# Patient Record
Sex: Female | Born: 1955 | Race: White | Hispanic: No | Marital: Married | Smoking: Never smoker
Health system: Southern US, Community
[De-identification: ages and names within clinical notes are randomized; demographics above are authoritative.]

## PROBLEM LIST (undated history)

## (undated) DIAGNOSIS — M1990 Unspecified osteoarthritis, unspecified site: Secondary | ICD-10-CM

## (undated) DIAGNOSIS — D649 Anemia, unspecified: Secondary | ICD-10-CM

## (undated) DIAGNOSIS — K219 Gastro-esophageal reflux disease without esophagitis: Secondary | ICD-10-CM

## (undated) DIAGNOSIS — F32A Depression, unspecified: Secondary | ICD-10-CM

## (undated) HISTORY — PX: ABDOMINAL HYSTERECTOMY: SHX81

## (undated) HISTORY — PX: HYSTERECTOMY ABDOMINAL WITH SALPINGECTOMY: SHX6725

## (undated) HISTORY — DX: Unspecified osteoarthritis, unspecified site: M19.90

## (undated) HISTORY — DX: Anemia, unspecified: D64.9

## (undated) HISTORY — DX: Depression, unspecified: F32.A

## (undated) HISTORY — PX: TONSILECTOMY, ADENOIDECTOMY, BILATERAL MYRINGOTOMY AND TUBES: SHX2538

## (undated) HISTORY — PX: TUBAL LIGATION: SHX77

## (undated) HISTORY — DX: Gastro-esophageal reflux disease without esophagitis: K21.9

---

## 2001-07-20 ENCOUNTER — Other Ambulatory Visit: Admission: RE | Admit: 2001-07-20 | Discharge: 2001-07-20 | Payer: Self-pay | Admitting: *Deleted

## 2002-08-31 ENCOUNTER — Other Ambulatory Visit: Admission: RE | Admit: 2002-08-31 | Discharge: 2002-08-31 | Payer: Self-pay | Admitting: *Deleted

## 2002-10-26 ENCOUNTER — Ambulatory Visit (HOSPITAL_COMMUNITY): Admission: RE | Admit: 2002-10-26 | Discharge: 2002-10-26 | Payer: Self-pay | Admitting: *Deleted

## 2003-09-10 ENCOUNTER — Other Ambulatory Visit: Admission: RE | Admit: 2003-09-10 | Discharge: 2003-09-10 | Payer: Self-pay | Admitting: *Deleted

## 2003-12-10 ENCOUNTER — Observation Stay (HOSPITAL_COMMUNITY): Admission: RE | Admit: 2003-12-10 | Discharge: 2003-12-11 | Payer: Self-pay | Admitting: *Deleted

## 2004-10-02 ENCOUNTER — Ambulatory Visit: Payer: Self-pay | Admitting: Internal Medicine

## 2004-10-05 ENCOUNTER — Ambulatory Visit (HOSPITAL_COMMUNITY): Admission: RE | Admit: 2004-10-05 | Discharge: 2004-10-05 | Payer: Self-pay | Admitting: Internal Medicine

## 2010-08-12 ENCOUNTER — Emergency Department (HOSPITAL_BASED_OUTPATIENT_CLINIC_OR_DEPARTMENT_OTHER)
Admission: EM | Admit: 2010-08-12 | Discharge: 2010-08-12 | Disposition: A | Discharge status: Home / Self Care | Payer: BLUE CROSS/BLUE SHIELD | Attending: Emergency Medicine | Admitting: Emergency Medicine

## 2010-08-12 DIAGNOSIS — R209 Unspecified disturbances of skin sensation: Secondary | ICD-10-CM | POA: Insufficient documentation

## 2010-08-12 DIAGNOSIS — M62838 Other muscle spasm: Secondary | ICD-10-CM | POA: Insufficient documentation

## 2018-07-13 DIAGNOSIS — J382 Nodules of vocal cords: Secondary | ICD-10-CM | POA: Insufficient documentation

## 2018-09-14 ENCOUNTER — Ambulatory Visit: Payer: 59 | Admitting: Orthopaedic Surgery

## 2018-09-14 ENCOUNTER — Encounter: Payer: Self-pay | Admitting: Orthopaedic Surgery

## 2018-09-14 ENCOUNTER — Other Ambulatory Visit: Payer: Self-pay

## 2018-09-14 ENCOUNTER — Ambulatory Visit: Payer: Self-pay

## 2018-09-14 ENCOUNTER — Encounter (INDEPENDENT_AMBULATORY_CARE_PROVIDER_SITE_OTHER): Payer: Self-pay

## 2018-09-14 VITALS — BP 152/90 | HR 81 | Ht 71.0 in | Wt 266.0 lb

## 2018-09-14 DIAGNOSIS — M659 Synovitis and tenosynovitis, unspecified: Secondary | ICD-10-CM

## 2018-09-14 DIAGNOSIS — M25562 Pain in left knee: Secondary | ICD-10-CM | POA: Diagnosis not present

## 2018-09-14 MED ORDER — BUPIVACAINE HCL 0.25 % IJ SOLN
2.0000 mL | INTRAMUSCULAR | Status: AC | PRN
  Administered 2018-09-14: 17:00:00 2 mL via INTRA_ARTICULAR

## 2018-09-14 MED ORDER — METHYLPREDNISOLONE ACETATE 40 MG/ML IJ SUSP
80.0000 mg | INTRAMUSCULAR | Status: AC | PRN
  Administered 2018-09-14: 17:00:00 80 mg via INTRA_ARTICULAR

## 2018-09-14 MED ORDER — LIDOCAINE HCL 1 % IJ SOLN
2.0000 mL | INTRAMUSCULAR | Status: AC | PRN
  Administered 2018-09-14: 2 mL

## 2018-09-14 NOTE — Progress Notes (Signed)
Office Visit Note   Patient: Natalie Myers           Date of Birth: [birth year]           MRN: [id number] Visit Date: 09/14/2018              Requested by: Angelica Chessman, MD [address] [place], [place] [address] PCP: Angelica Chessman, MD   Assessment & Plan: Visit Diagnoses:  1. Acute pain of left knee     Plan:  #1: Cortico steroid injection to the left knee was accomplished atraumatically. #2: If this fails then she may need to consider MRI scanning.  Follow-Up Instructions: Return if symptoms worsen or fail to improve.   Orders:  Orders Placed This Encounter  Procedures  . XR KNEE 3 VIEW LEFT   No orders of the defined types were placed in this encounter.     Procedures: Large Joint Inj: L knee on 09/14/2018 4:59 PM Indications: pain and diagnostic evaluation Details: 25 G 1.5 in needle, anteromedial approach  Arthrogram: No  Medications: 2 mL lidocaine 1 %; 80 mg methylPREDNISolone acetate 40 MG/ML; 2 mL bupivacaine 0.25 % Procedure, treatment alternatives, risks and benefits explained, specific risks discussed. Consent was given by the patient. Patient was prepped and draped in the usual sterile fashion.       Clinical Data: No additional findings.   Subjective: Chief Complaint  Patient presents with  . Left Knee - Pain   HPI Patient presents today with left knee pain. She said that it has been present for a week. No known injury. The pain was initially in the posterior knee, but now also hurts anteriorly. Some mild swelling. No popping, clicking, or grinding. The pain is constant, but gets worse with certain movements. She is taking tylenol as needed.    Review of Systems  Constitutional: Negative for fatigue.  HENT: Negative for ear pain.   Eyes: Negative for pain.  Respiratory: Negative for shortness of breath.   Cardiovascular: Negative for leg swelling.  Gastrointestinal: Negative for constipation and diarrhea.  Endocrine:  Negative for cold intolerance and heat intolerance.  Genitourinary: Negative for difficulty urinating.  Musculoskeletal: Positive for joint swelling.  Skin: Negative for rash.  Allergic/Immunologic: Negative for food allergies.  Neurological: Negative for weakness.  Hematological: Does not bruise/bleed easily.  Psychiatric/Behavioral: Positive for sleep disturbance.     Objective: Vital Signs: BP (!) 152/90   Pulse 81   Ht 5\' 11"  (1.803 m)   Wt 266 lb (120.7 kg)   BMI 37.10 kg/m   Physical Exam Constitutional:      Appearance: She is well-developed.  Eyes:     Pupils: Pupils are equal, round, and reactive to light.  Pulmonary:     Effort: Pulmonary effort is normal.  Skin:    General: Skin is warm and dry.  Neurological:     Mental Status: She is alert and oriented to person, place, and time.  Psychiatric:        Behavior: Behavior normal.     Ortho Exam  Exam today reveals a very large left knee on exam.  She does have swelling in the posterior aspect of the knee as well as what appears to be a mild effusion of the knee without warmth or erythema.  She does have some patellofemoral crepitance this is mild.  Tender over the inferior pole the patella as well as the medial joint line.  McMurray's is not prominent.  Possibly a little bit of an anterior drawer but has a good endpoint.  Specialty Comments:  No specialty comments available.  Imaging: Xr Knee 3 View Left  Result Date: 09/14/2018 Basis with some peaking of the intercondylar spines.  There may be some subtle signs of chondrocalcinosis on the medial lateral joint spaces on the AP x-ray.  Does have some lateral positioning of the patella.  There appears to be some eminence of the proximal patella insertion.    PMFS History: Current Outpatient Medications  Medication Sig Dispense Refill  . acetaminophen (TYLENOL) 325 MG tablet Take 650 mg by mouth every 6 (six) hours as needed.    Marland Kitchen. omeprazole (PRILOSEC) 40 MG  capsule      No current facility-administered medications for this visit.     There are no active problems to display for this patient.  History reviewed. No pertinent past medical history.  No family history on file.  Past Surgical History:  Procedure Laterality Date  . CESAREAN SECTION    . HYSTERECTOMY ABDOMINAL WITH SALPINGECTOMY    . TONSILECTOMY, ADENOIDECTOMY, BILATERAL MYRINGOTOMY AND TUBES     Social History   Occupational History  . Not on file  Tobacco Use  . Smoking status: Never Smoker  . Smokeless tobacco: Never Used  Substance and Sexual Activity  . Alcohol use: Never    Frequency: Never  . Drug use: Not on file  . Sexual activity: Not on file

## 2018-09-29 ENCOUNTER — Ambulatory Visit: Payer: 59 | Admitting: Orthopaedic Surgery

## 2018-10-02 ENCOUNTER — Other Ambulatory Visit: Payer: Self-pay

## 2018-10-02 ENCOUNTER — Ambulatory Visit: Payer: 59 | Admitting: Orthopaedic Surgery

## 2018-10-02 ENCOUNTER — Encounter: Payer: Self-pay | Admitting: Orthopaedic Surgery

## 2018-10-02 DIAGNOSIS — M25562 Pain in left knee: Secondary | ICD-10-CM | POA: Diagnosis not present

## 2018-10-02 NOTE — Progress Notes (Signed)
Office Visit Note   Patient: NATALEA KRENGEL           Date of Birth: [birth year]           MRN: [id number] Visit Date: 10/02/2018              Requested by: Angelica Chessman, MD [address] [place], [place] [address] PCP: Angelica Chessman, MD   Assessment & Plan: Visit Diagnoses:  1. Acute pain of left knee     Plan: Persistent pain left knee acutely medially despite time, medicines and cortisone injection.  Concerned that she may have a tear of the medial meniscus or significant degenerative change that I cannot appreciate by plain film.  Pain has been present now about a month.  Patient walks with a limp.  Will order MRI scan  Follow-Up Instructions: No follow-ups on file.   Orders:  Orders Placed This Encounter  Procedures  . MR Knee Left w/o contrast   No orders of the defined types were placed in this encounter.     Procedures: No procedures performed   Clinical Data: No additional findings.   Subjective: Chief Complaint  Patient presents with  . Left Knee - Follow-up  Patient presents today for a follow up on her left knee. She received a cortisone injection on 09/14/18. Patient states that the injection did not help. She is taking Advil as needed for pain.   HPI  Review of Systems   Objective: Vital Signs: BP (!) 154/87   Pulse 79   Ht 5\' 11"  (1.803 m)   Wt 266 lb (120.7 kg)   BMI 37.10 kg/m   Physical Exam Constitutional:      Appearance: She is well-developed.  Eyes:     Pupils: Pupils are equal, round, and reactive to light.  Pulmonary:     Effort: Pulmonary effort is normal.  Skin:    General: Skin is warm and dry.  Neurological:     Mental Status: She is alert and oriented to person, place, and time.  Psychiatric:        Behavior: Behavior normal.     Ortho Exam awake alert and oriented x3.  Comfortable sitting.  Does walk with a limp referable to her left knee.  Has predominately medial joint pain.  Large knees.  Does  have adipose tissue both medially and laterally more so on the left knee than she does on the right but that area is not painful.  No popping or clicking.  Full extension flexion probably 100 degrees.  No evidence of instability.  No calf pain.  No hip discomfort is.  Straight leg raise negative  Specialty Comments:  No specialty comments available.  Imaging: No results found.   PMFS History: Patient Active Problem List   Diagnosis Date Noted  . Acute pain of left knee 10/02/2018   History reviewed. No pertinent past medical history.  History reviewed. No pertinent family history.  Past Surgical History:  Procedure Laterality Date  . CESAREAN SECTION    . HYSTERECTOMY ABDOMINAL WITH SALPINGECTOMY    . TONSILECTOMY, ADENOIDECTOMY, BILATERAL MYRINGOTOMY AND TUBES     Social History   Occupational History  . Not on file  Tobacco Use  . Smoking status: Never Smoker  . Smokeless tobacco: Never Used  Substance and Sexual Activity  . Alcohol use: Never    Frequency: Never  . Drug use: Not on file  . Sexual activity: Not on file

## 2018-10-21 ENCOUNTER — Other Ambulatory Visit: Payer: Self-pay

## 2018-10-21 ENCOUNTER — Ambulatory Visit
Admission: RE | Admit: 2018-10-21 | Discharge: 2018-10-21 | Disposition: A | Discharge status: Home / Self Care | Payer: 59 | Source: Ambulatory Visit | Attending: Orthopaedic Surgery | Admitting: Orthopaedic Surgery

## 2018-10-21 DIAGNOSIS — M25562 Pain in left knee: Secondary | ICD-10-CM

## 2018-10-24 ENCOUNTER — Encounter: Payer: Self-pay | Admitting: Orthopaedic Surgery

## 2018-10-24 ENCOUNTER — Other Ambulatory Visit: Payer: Self-pay

## 2018-10-24 ENCOUNTER — Ambulatory Visit (INDEPENDENT_AMBULATORY_CARE_PROVIDER_SITE_OTHER): Payer: 59 | Admitting: Orthopaedic Surgery

## 2018-10-24 VITALS — BP 168/98 | HR 70 | Ht 71.0 in | Wt 265.0 lb

## 2018-10-24 DIAGNOSIS — M25562 Pain in left knee: Secondary | ICD-10-CM

## 2018-10-24 NOTE — Progress Notes (Signed)
Office Visit Note   Patient: Natalie Myers           Date of Birth: 07-11-1955           MRN: [id number] Visit Date: 10/24/2018              Requested by: Natalie Myers, Natalie M, MD [address] [place],  [place] [id number] PCP: Natalie Myers, Natalie M, MD   Assessment & Plan: Visit Diagnoses:  1. Acute pain of left knee     Plan: MRI scan demonstrates a tear of the medial meniscus associated with tricompartmental degenerative changes and stress reaction in the medial tibial plateau.  Long discussion over 30 minutes regarding all the above and treatment options.  Mrs. Natalie Myers relates that she is actually feeling a little bit better.  She like to continue with her exercises and over-the-counter medicines and monitor her response.  I would consider knee arthroscopy to debride the medial meniscus and possibly some of the articular cartilage abnormalities as she does have some mechanical symptoms.  She is fully aware that a lot of her problems might be related to the arthritis and arthroscopy would not solve that.  She does have a BMI of 37 and I have discussed exercises and weight loss as well.  Follow-Up Instructions: Return if symptoms worsen or fail to improve.   Orders:  No orders of the defined types were placed in this encounter.  No orders of the defined types were placed in this encounter.     Procedures: No procedures performed   Clinical Data: No additional findings.   Subjective: Chief Complaint  Patient presents with  . Left Knee - Follow-up  Patient presents today for a three week follow up on her left knee. She had an MRI on 10/21/18 and is here today for those results. Patient states that her knee still hurts, but doing better. She takes Advil as needed.  HPI  Review of Systems   Objective: Vital Signs: BP (!) 168/98   Pulse 70   Ht 5\' 11"  (1.803 Myers)   Wt 265 lb (120.2 kg)   BMI 36.96 kg/Myers   Physical Exam Constitutional:      Appearance: She is  well-developed.  Eyes:     Pupils: Pupils are equal, round, and reactive to light.  Pulmonary:     Effort: Pulmonary effort is normal.  Skin:    General: Skin is warm and dry.  Neurological:     Mental Status: She is alert and oriented to person, place, and time.  Psychiatric:        Behavior: Behavior normal.     Ortho Exam large left knee.  Difficult to know if there was an effusion.  Predominately medial joint pain diffusely.  No pain laterally.  Some patellar crepitation but no pain with patellar compression.  No calf pain.  Mild ankle edema edema that is nonpitting.  Painless range of motion both hips  Specialty Comments:  No specialty comments available.  Imaging: No results found.   PMFS History: Patient Active Problem List   Diagnosis Date Noted  . Acute pain of left knee 10/02/2018   History reviewed. No pertinent past medical history.  History reviewed. No pertinent family history.  Past Surgical History:  Procedure Laterality Date  . CESAREAN SECTION    . HYSTERECTOMY ABDOMINAL WITH SALPINGECTOMY    . TONSILECTOMY, ADENOIDECTOMY, BILATERAL MYRINGOTOMY AND TUBES     Social History   Occupational History  .  Not on file  Tobacco Use  . Smoking status: Never Smoker  . Smokeless tobacco: Never Used  Substance and Sexual Activity  . Alcohol use: Never    Frequency: Never  . Drug use: Not on file  . Sexual activity: Not on file

## 2019-01-15 LAB — HM COLONOSCOPY

## 2019-05-23 DIAGNOSIS — Z860100 Personal history of colon polyps, unspecified: Secondary | ICD-10-CM | POA: Insufficient documentation

## 2019-05-23 DIAGNOSIS — K219 Gastro-esophageal reflux disease without esophagitis: Secondary | ICD-10-CM | POA: Insufficient documentation

## 2019-05-23 DIAGNOSIS — D649 Anemia, unspecified: Secondary | ICD-10-CM | POA: Insufficient documentation

## 2019-05-23 DIAGNOSIS — G8929 Other chronic pain: Secondary | ICD-10-CM | POA: Insufficient documentation

## 2019-10-16 DIAGNOSIS — D509 Iron deficiency anemia, unspecified: Secondary | ICD-10-CM | POA: Insufficient documentation

## 2019-11-01 IMAGING — MR MRI OF THE LEFT KNEE WITHOUT CONTRAST
4 of 6 series · 22 of 40 positions shown · non-contrast
Comparison: Radiographs 09/14/2018

CLINICAL DATA: Medial knee pain and weakness. Lifting injury 8
weeks ago.

EXAM:
MRI OF THE LEFT KNEE WITHOUT CONTRAST
TECHNIQUE: Multiplanar, multisequence MR imaging of the knee was performed. No
intravenous contrast was administered.

[Series 3: T2 fat-sat · axial · 4.0mm · 0.50mm/px · z∈[-86,+9]mm · 6 of 24 slices shown (1 of 2)]
[im 1/24]
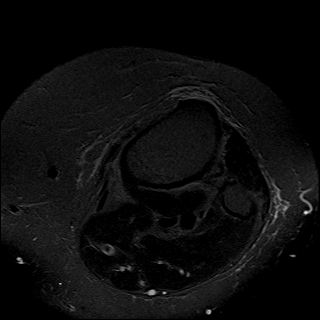
[im 4/24]
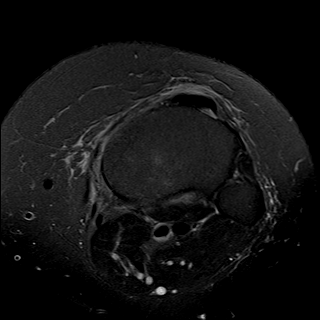
[im 8/24]
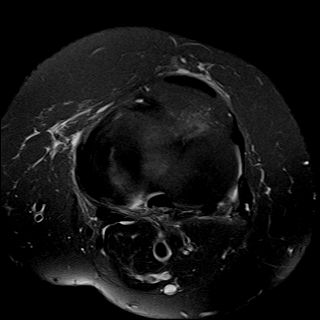
[im 12/24]
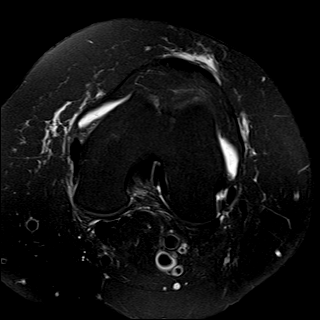
[im 16/24]
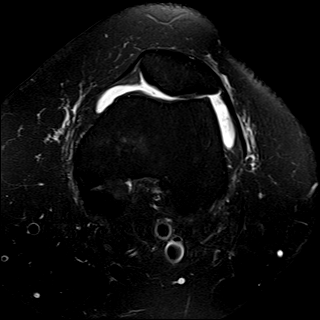
[im 20/24]
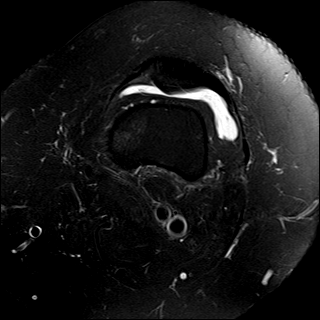

[Series 5: T2 fat-sat · coronal · 4.0mm · 0.29mm/px · 3 of 23 slices shown (2 of 2)]
[im 5/23]
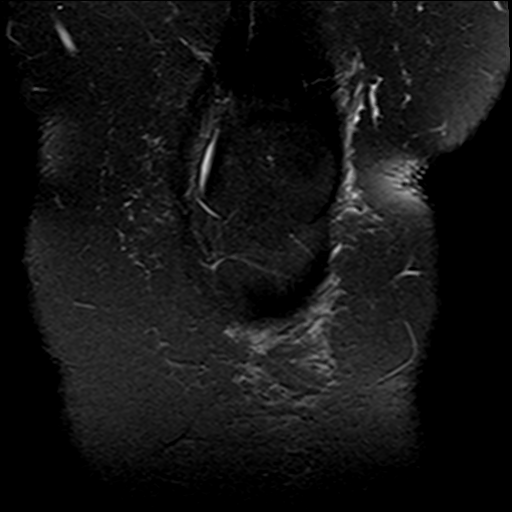
[im 14/23]
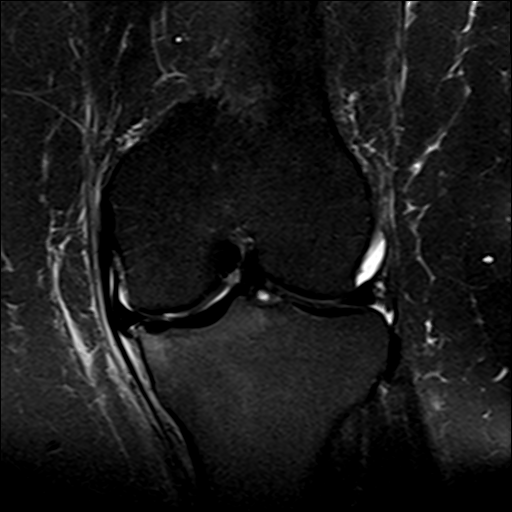
[im 23/23]
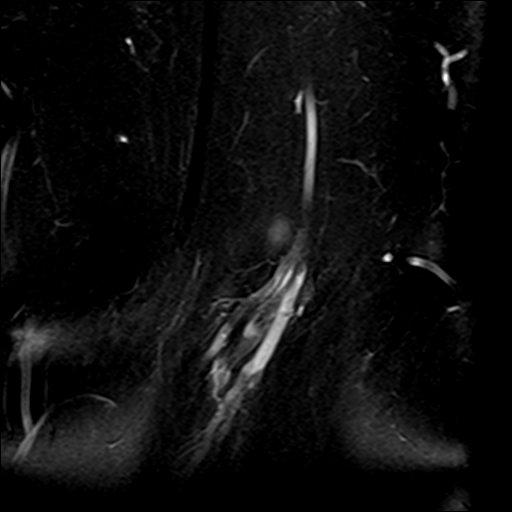

[Series 7: PD fat-sat · sagittal · 3.0mm · 0.29mm/px · 7 of 29 slices shown (1 of 2)]
[im 1/29]
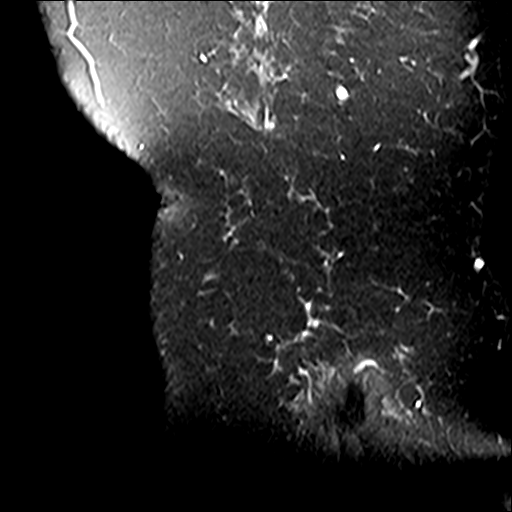
[im 5/29]
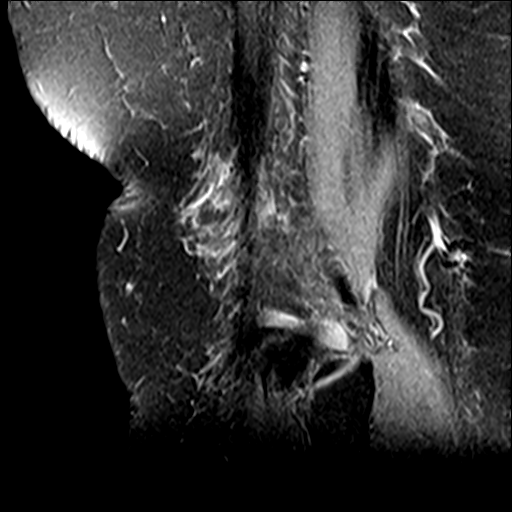
[im 10/29]
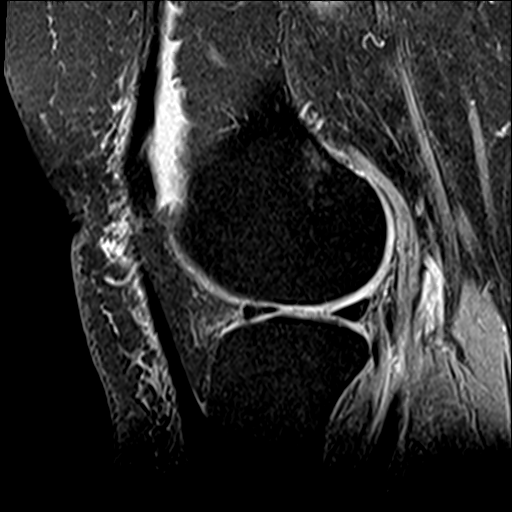
[im 15/29]
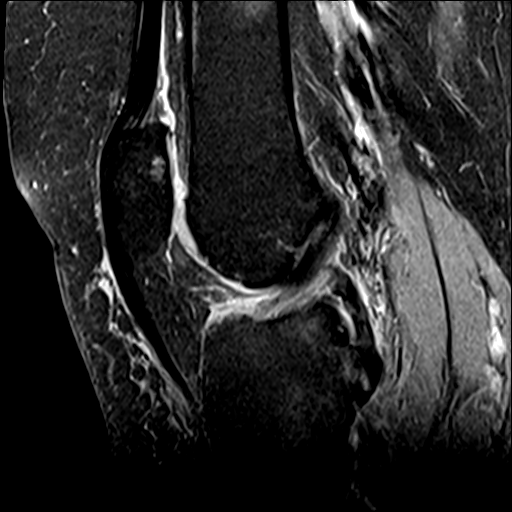
[im 19/29]
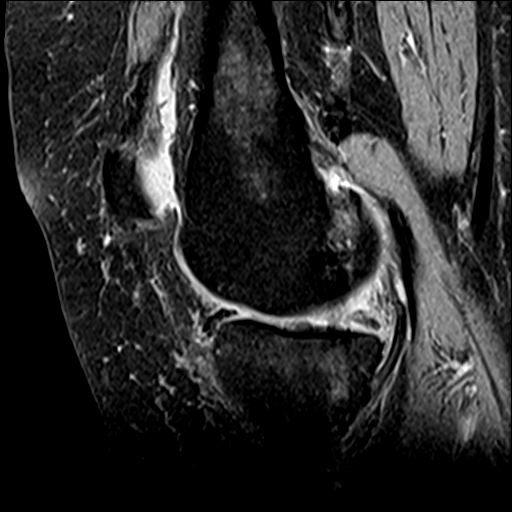
[im 24/29]
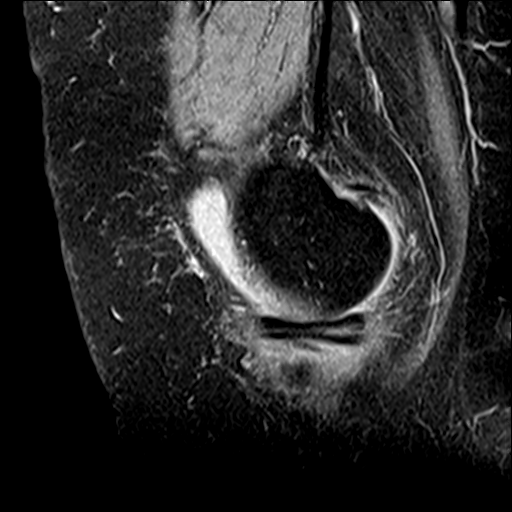
[im 29/29]
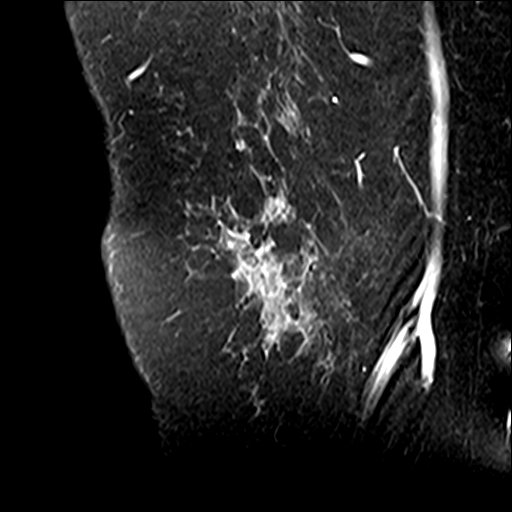

[Series 8: PD fat-sat · coronal · 4.0mm · 0.29mm/px · 6 of 25 slices shown (2 of 2)]
[im 1/25]
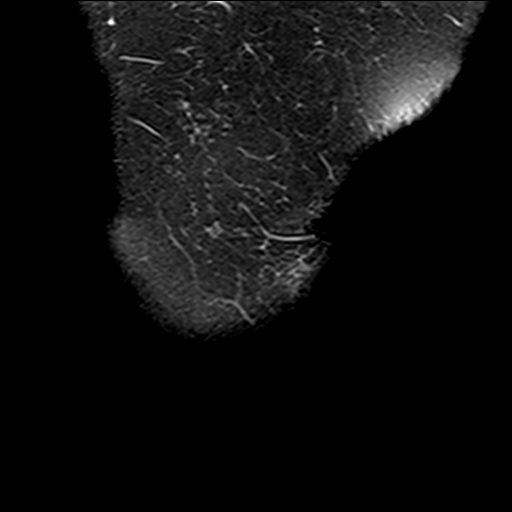
[im 5/25]
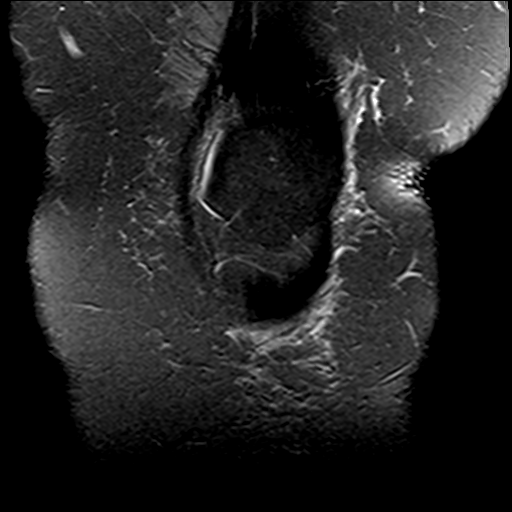
[im 10/25]
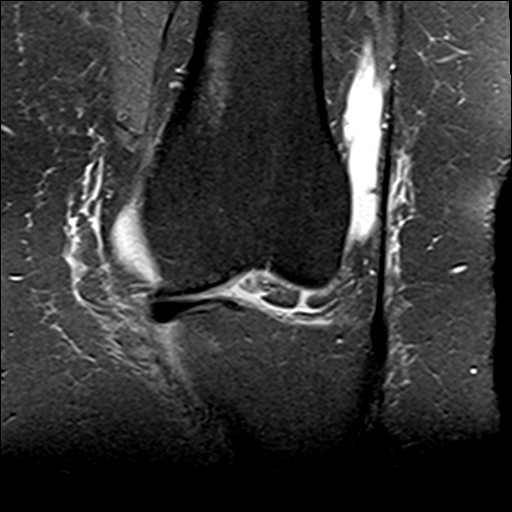
[im 15/25]
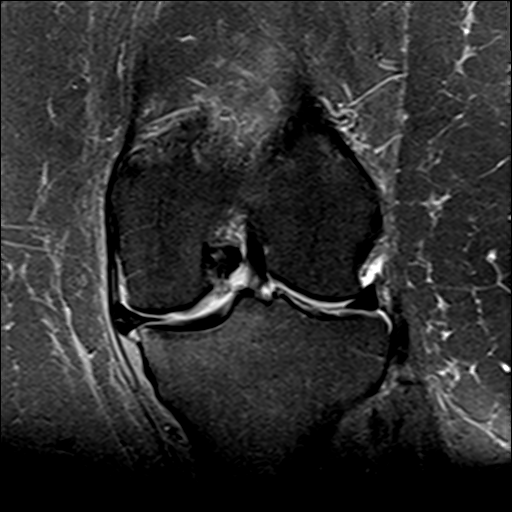
[im 20/25]
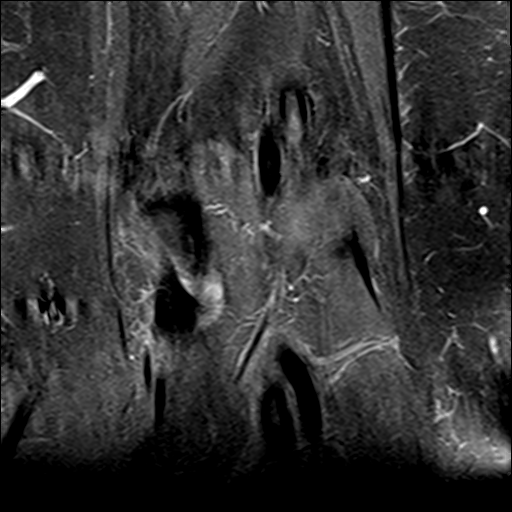
[im 25/25]
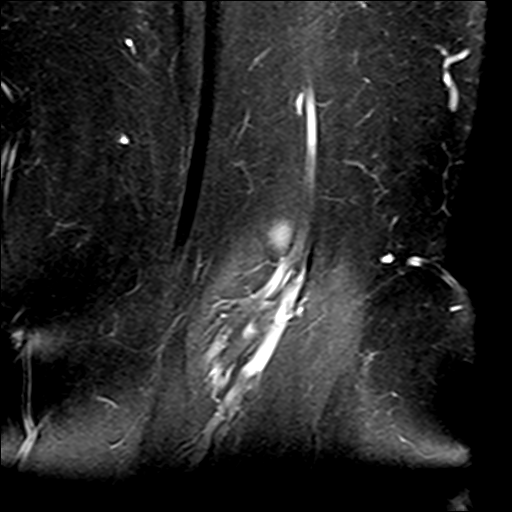

[22 of 40 positions shown; findings below may reference images not displayed]

FINDINGS: MENISCI

Medial meniscus: There is a full-thickness radial tear at the
meniscal root with detachment and associated medial protrusion of
the dysfunctional meniscus which demonstrates intrasubstance
mucinous degeneration. Maximum medial protrusion is approximately 5
mm.

Lateral meniscus:  Intact

LIGAMENTS

Cruciates:  Intact

Collaterals: Intact. Mild to moderate MCL and pes anserine bursitis.

CARTILAGE

Patellofemoral: Moderate degenerative chondrosis with areas of
moderate cartilage thinning and early subchondral cystic change.

Medial: Moderate degenerative chondrosis with areas of near
full-thickness cartilage loss. There is also joint space narrowing,
spurring and subchondral edema in the medial tibial plateau
medially. Findings suspicious for a small subchondral stress
fracture.

Lateral: Moderate degenerative chondrosis with early joint space
narrowing and spurring.

Joint:  Moderate-sized joint effusion and mild synovitis.

Popliteal Fossa:  Small Baker's cyst.

Extensor Mechanism: The patella retinacular structures are intact
and the quadriceps and patellar tendons are intact.

Bones: Subchondral stress edema in the medial tibial plateau with
suspected small subchondral stress fracture.

Other: Normal knee musculature.
IMPRESSION: 1. Radial tear involving the posterior horn of the medial meniscus
with detachment from the meniscal root and associated medial
protrusion of the meniscus.
2. Intact ligamentous structures.
3. Tricompartmental degenerative changes as detailed above.
4. Stress edema in the medial tibial plateau with suspected small
subchondral stress fracture.
5. Moderate-sized joint effusion and small Baker's cyst.

## 2022-03-29 DIAGNOSIS — D508 Other iron deficiency anemias: Secondary | ICD-10-CM | POA: Diagnosis not present

## 2022-03-29 DIAGNOSIS — M25562 Pain in left knee: Secondary | ICD-10-CM | POA: Diagnosis not present

## 2022-03-29 DIAGNOSIS — Z0000 Encounter for general adult medical examination without abnormal findings: Secondary | ICD-10-CM | POA: Diagnosis not present

## 2022-03-29 DIAGNOSIS — G8929 Other chronic pain: Secondary | ICD-10-CM | POA: Diagnosis not present

## 2022-03-29 DIAGNOSIS — M25559 Pain in unspecified hip: Secondary | ICD-10-CM | POA: Diagnosis not present

## 2022-03-29 DIAGNOSIS — Z780 Asymptomatic menopausal state: Secondary | ICD-10-CM | POA: Diagnosis not present

## 2022-03-29 DIAGNOSIS — E7800 Pure hypercholesterolemia, unspecified: Secondary | ICD-10-CM | POA: Diagnosis not present

## 2022-03-29 DIAGNOSIS — D649 Anemia, unspecified: Secondary | ICD-10-CM | POA: Diagnosis not present

## 2022-03-29 DIAGNOSIS — Z23 Encounter for immunization: Secondary | ICD-10-CM | POA: Diagnosis not present

## 2022-03-29 DIAGNOSIS — D229 Melanocytic nevi, unspecified: Secondary | ICD-10-CM | POA: Diagnosis not present

## 2022-03-29 DIAGNOSIS — Z0001 Encounter for general adult medical examination with abnormal findings: Secondary | ICD-10-CM | POA: Diagnosis not present

## 2022-03-29 DIAGNOSIS — Z6834 Body mass index (BMI) 34.0-34.9, adult: Secondary | ICD-10-CM | POA: Diagnosis not present

## 2022-04-14 DIAGNOSIS — E876 Hypokalemia: Secondary | ICD-10-CM | POA: Diagnosis not present

## 2022-05-05 DIAGNOSIS — M25561 Pain in right knee: Secondary | ICD-10-CM | POA: Diagnosis not present

## 2022-05-05 DIAGNOSIS — M1711 Unilateral primary osteoarthritis, right knee: Secondary | ICD-10-CM | POA: Diagnosis not present

## 2022-07-30 DIAGNOSIS — D509 Iron deficiency anemia, unspecified: Secondary | ICD-10-CM | POA: Diagnosis not present

## 2022-07-30 DIAGNOSIS — Z23 Encounter for immunization: Secondary | ICD-10-CM | POA: Diagnosis not present

## 2022-07-30 DIAGNOSIS — R030 Elevated blood-pressure reading, without diagnosis of hypertension: Secondary | ICD-10-CM | POA: Diagnosis not present

## 2022-07-30 DIAGNOSIS — E876 Hypokalemia: Secondary | ICD-10-CM | POA: Diagnosis not present

## 2022-07-30 DIAGNOSIS — Z79899 Other long term (current) drug therapy: Secondary | ICD-10-CM | POA: Diagnosis not present

## 2022-09-28 DIAGNOSIS — E876 Hypokalemia: Secondary | ICD-10-CM | POA: Diagnosis not present

## 2022-09-28 DIAGNOSIS — Z23 Encounter for immunization: Secondary | ICD-10-CM | POA: Diagnosis not present

## 2022-09-28 DIAGNOSIS — Z1231 Encounter for screening mammogram for malignant neoplasm of breast: Secondary | ICD-10-CM | POA: Diagnosis not present

## 2022-09-28 LAB — HM MAMMOGRAPHY: HM Mammogram: NORMAL (ref 0–4)

## 2022-11-09 DIAGNOSIS — L814 Other melanin hyperpigmentation: Secondary | ICD-10-CM | POA: Diagnosis not present

## 2022-11-09 DIAGNOSIS — D1801 Hemangioma of skin and subcutaneous tissue: Secondary | ICD-10-CM | POA: Diagnosis not present

## 2022-11-09 DIAGNOSIS — L821 Other seborrheic keratosis: Secondary | ICD-10-CM | POA: Diagnosis not present

## 2022-11-09 DIAGNOSIS — D508 Other iron deficiency anemias: Secondary | ICD-10-CM | POA: Diagnosis not present

## 2022-11-09 DIAGNOSIS — K219 Gastro-esophageal reflux disease without esophagitis: Secondary | ICD-10-CM | POA: Diagnosis not present

## 2022-11-09 DIAGNOSIS — R1013 Epigastric pain: Secondary | ICD-10-CM | POA: Diagnosis not present

## 2022-11-09 DIAGNOSIS — L570 Actinic keratosis: Secondary | ICD-10-CM | POA: Diagnosis not present

## 2022-11-09 DIAGNOSIS — Z6836 Body mass index (BMI) 36.0-36.9, adult: Secondary | ICD-10-CM | POA: Diagnosis not present

## 2023-01-11 DIAGNOSIS — D509 Iron deficiency anemia, unspecified: Secondary | ICD-10-CM | POA: Diagnosis not present

## 2023-01-20 DIAGNOSIS — D508 Other iron deficiency anemias: Secondary | ICD-10-CM | POA: Diagnosis not present

## 2023-01-27 DIAGNOSIS — D508 Other iron deficiency anemias: Secondary | ICD-10-CM | POA: Diagnosis not present

## 2023-02-03 DIAGNOSIS — D508 Other iron deficiency anemias: Secondary | ICD-10-CM | POA: Diagnosis not present

## 2023-02-10 DIAGNOSIS — D508 Other iron deficiency anemias: Secondary | ICD-10-CM | POA: Diagnosis not present

## 2023-02-17 DIAGNOSIS — D508 Other iron deficiency anemias: Secondary | ICD-10-CM | POA: Diagnosis not present

## 2023-02-24 DIAGNOSIS — D508 Other iron deficiency anemias: Secondary | ICD-10-CM | POA: Diagnosis not present

## 2023-05-05 DIAGNOSIS — K449 Diaphragmatic hernia without obstruction or gangrene: Secondary | ICD-10-CM | POA: Diagnosis not present

## 2023-05-05 DIAGNOSIS — R918 Other nonspecific abnormal finding of lung field: Secondary | ICD-10-CM | POA: Diagnosis not present

## 2023-05-05 DIAGNOSIS — R0602 Shortness of breath: Secondary | ICD-10-CM | POA: Diagnosis not present

## 2023-05-24 DIAGNOSIS — R918 Other nonspecific abnormal finding of lung field: Secondary | ICD-10-CM | POA: Diagnosis not present

## 2023-05-24 DIAGNOSIS — K449 Diaphragmatic hernia without obstruction or gangrene: Secondary | ICD-10-CM | POA: Diagnosis not present

## 2023-05-24 DIAGNOSIS — R0600 Dyspnea, unspecified: Secondary | ICD-10-CM | POA: Diagnosis not present

## 2023-05-24 DIAGNOSIS — R0602 Shortness of breath: Secondary | ICD-10-CM | POA: Diagnosis not present

## 2023-05-24 DIAGNOSIS — I77810 Thoracic aortic ectasia: Secondary | ICD-10-CM | POA: Diagnosis not present

## 2023-05-31 DIAGNOSIS — J984 Other disorders of lung: Secondary | ICD-10-CM | POA: Diagnosis not present

## 2023-06-07 DIAGNOSIS — E6609 Other obesity due to excess calories: Secondary | ICD-10-CM | POA: Insufficient documentation

## 2023-06-07 DIAGNOSIS — Q790 Congenital diaphragmatic hernia: Secondary | ICD-10-CM | POA: Diagnosis not present

## 2023-06-07 DIAGNOSIS — J4520 Mild intermittent asthma, uncomplicated: Secondary | ICD-10-CM | POA: Insufficient documentation

## 2023-06-07 DIAGNOSIS — R1310 Dysphagia, unspecified: Secondary | ICD-10-CM | POA: Insufficient documentation

## 2023-06-07 DIAGNOSIS — R0609 Other forms of dyspnea: Secondary | ICD-10-CM | POA: Insufficient documentation

## 2023-06-07 DIAGNOSIS — K219 Gastro-esophageal reflux disease without esophagitis: Secondary | ICD-10-CM | POA: Diagnosis not present

## 2023-06-07 DIAGNOSIS — K449 Diaphragmatic hernia without obstruction or gangrene: Secondary | ICD-10-CM | POA: Insufficient documentation

## 2023-06-28 ENCOUNTER — Encounter: Payer: Self-pay | Admitting: Family Medicine

## 2023-06-28 ENCOUNTER — Ambulatory Visit (INDEPENDENT_AMBULATORY_CARE_PROVIDER_SITE_OTHER): Payer: BC Managed Care – PPO | Admitting: Family Medicine

## 2023-06-28 VITALS — BP 124/72 | HR 77 | Temp 98.4°F | Ht 69.75 in | Wt 262.2 lb

## 2023-06-28 DIAGNOSIS — Z6837 Body mass index (BMI) 37.0-37.9, adult: Secondary | ICD-10-CM

## 2023-06-28 DIAGNOSIS — D509 Iron deficiency anemia, unspecified: Secondary | ICD-10-CM | POA: Diagnosis not present

## 2023-06-28 DIAGNOSIS — Z23 Encounter for immunization: Secondary | ICD-10-CM | POA: Insufficient documentation

## 2023-06-28 DIAGNOSIS — Z780 Asymptomatic menopausal state: Secondary | ICD-10-CM | POA: Diagnosis not present

## 2023-06-28 DIAGNOSIS — Z1231 Encounter for screening mammogram for malignant neoplasm of breast: Secondary | ICD-10-CM

## 2023-06-28 DIAGNOSIS — R7309 Other abnormal glucose: Secondary | ICD-10-CM

## 2023-06-28 DIAGNOSIS — Z860100 Personal history of colon polyps, unspecified: Secondary | ICD-10-CM

## 2023-06-28 DIAGNOSIS — Z136 Encounter for screening for cardiovascular disorders: Secondary | ICD-10-CM | POA: Diagnosis not present

## 2023-06-28 DIAGNOSIS — K219 Gastro-esophageal reflux disease without esophagitis: Secondary | ICD-10-CM

## 2023-06-28 DIAGNOSIS — E669 Obesity, unspecified: Secondary | ICD-10-CM | POA: Insufficient documentation

## 2023-06-28 MED ORDER — SEMAGLUTIDE-WEIGHT MANAGEMENT 0.25 MG/0.5ML ~~LOC~~ SOAJ
0.2500 mg | SUBCUTANEOUS | 1 refills | Status: DC
Start: 2023-06-28 — End: 2023-07-08

## 2023-06-28 NOTE — Progress Notes (Signed)
 [id number]  Patient Office Visit  Assessment & Plan:  Iron deficiency anemia, unspecified iron deficiency anemia type -     CBC with Differential/Platelet -     COMPLETE METABOLIC PANEL WITH GFR -     Iron, TIBC and Ferritin Panel  History of colonic polyps  Needs flu shot -     Flu Vaccine Trivalent High Dose (Fluad)  Postmenopausal -     DG Bone Density; Future  Elevated glucose -     Lipid panel -     Hemoglobin A1c -     TSH  Visit for screening mammogram -     MM 3D DIAGNOSTIC MAMMOGRAM BILATERAL BREAST; Future  Obesity (BMI 30-39.9) -     Semaglutide-Weight Management; Inject 0.25 mg into the skin once a week.  Dispense: 2 mL; Refill: 1  Gastroesophageal reflux disease without esophagitis   Follow-up on lab work and notify patient.  Restart the Wegovy 0.25 mg weekly.  Patient is aware of potential negative side effects.  Return in the next 3 months or so for his complete physical. Recommend healthy diet i.e mediterranean/DASH diet, consistent exercise - 30 minutes 5 day per week, and gradual weight loss. DEXA scan and mammogram ordered at [facility].  Copy of the CT scan that was done at [facility] January 21 given and reviewed with the patient No follow-ups on file.   Subjective:    Patient ID: Natalie Myers, female    DOB: 25-Mar-1956  Age: 68 y.o. MRN: [id number]  No chief complaint on file.   HPI GERD-history of gastroesophageal reflux disease (GERD), managed with daily Protonix 40 mg, which she reports as effective. She has not experienced any side effects from this medication. reports no chronic sore throat, voice changes, hemoptysis, or hematochezia. Pt has large hiatal hernia and another type of hernia. Pt is doing much better with the Protonix medication and does not need any refills.  Shortness of breath- pt saw Pulmonary re ongoing shortness and breath and CXR ordered which showed an opacity which lead to the CT scan chest. Pt still having same symptoms. Never  smoked  IMPRESSION:  1. No acute pulmonary disease. No lung masses.  2. Large fat containing right Morgagni hernia, which accounts for  the chest radiograph abnormality on 05/05/2023 chest radiograph.  3. Large hiatal hernia.  4. 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 Recommend annual imaging  followup by CTA or MRA. This recommendation follows 2010  ACCF/AHA/AATS/ACR/ASA/SCA/SCAI/SIR/STS/SVM Guidelines for the  Diagnosis and Management of Patients with Thoracic Aortic Disease.  Circulation. 2010; 121: U440-H474. Aortic aneurysm NOS  (ICD10-I71.9).  5. Aortic Atherosclerosis (ICD10-I70.0).  Electronically Signed    By: Delbert Phenix M.D.    On: 06/03/2023 18:29  Dilated Ascending Aorta- pt will repeat this in one year.  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 Recommend annual imaging followup by CTA or MRA--Pt was not aware of this.  Obesity- pt has been gaining weight the past year or so. Pt was on Wegovy shots before but then could not get them at the pharmacy due to supply issues. Pt got frustrated so did not want to go back on the shots. Pt wants to get some help to lose weight. Pt is open to going to weight management at [facility] if [facility] shots are not covered by her insurance.  Iron deficiency Anemia-patient has had a 4-5 year history of iron deficiency anemia which has required IV iron infusions in [place].  Patient states initially she  felt much better when she got her first Iron IV infusion but now she does not feel any different.  Patient had an extensive workup per GI and there was no cause for her iron deficiency anemia so then saw hematology.  Patient is taking over-the-counter iron which is equivalent to Niferex-150 -2/day.  Patient does have upcoming appointment hematology in March for labs and wonders if she can cancel the appt Previous Hx of prediabetes-Pt has hx of this few years ago but then lost weight and A1C was in the normal range. Denies diarrhea, peripheral swelling,  hypoglycemia, excessive thirst, excessive urination, visual fluctuation, and fatigue.  Making an effort on diet control and exercise.  Pt has no previous hx of gestational diabetes..  Patient does eat healthy for the most part and stays active. Pt is worried that she is not able to lose weight the past several months.  Health maintenance-patient does need a mammogram.  Last mammogram was 2024.  Patient has never had a bone density but would like to do this the same day.  Up-to-date colonoscopy was done in 2020 and needs repeat in 2025 due to history of polyps. Pt has UTD Shingrix vaccines, UTD Tetanus shot Pt needs to get flu shot today.   The 10-year ASCVD risk score (Arnett DK, et al., 2019) is: 5.1%   Values used to calculate the score:     Age: 48 years     Sex: Female     Is Non-Hispanic African American: No     Diabetic: No     Tobacco smoker: No     Systolic Blood Pressure: 124 mmHg     Is BP treated: No     HDL Cholesterol: 76 mg/dL     Total Cholesterol: 249 mg/dL   The 91-YNWG ASCVD risk score (Arnett DK, et al., 2019) is: 5.1%  Past Medical History:  Diagnosis Date   Anemia    GERD (gastroesophageal reflux disease)    Past Surgical History:  Procedure Laterality Date   CESAREAN SECTION     1992, 1994   HYSTERECTOMY ABDOMINAL WITH SALPINGECTOMY     TONSILECTOMY, ADENOIDECTOMY, BILATERAL MYRINGOTOMY AND TUBES     Social History   Tobacco Use   Smoking status: Never   Smokeless tobacco: Never  Vaping Use   Vaping status: Never Used  Substance Use Topics   Alcohol use: Never   Drug use: Never   Family History  Problem Relation Age of Onset   Uterine cancer Mother    Skin cancer Mother    Head & neck cancer Father    Kidney cancer Father    Allergies  Allergen Reactions   Tape Rash    Adhesive tape    ROS    Objective:    BP 124/72   Pulse 77   Temp 98.4 F (36.9 C)   Ht 5' 9.75" (1.772 m)   Wt 262 lb 4 oz (119 kg)   SpO2 97%   BMI 37.90 kg/m   BP Readings from Last 3 Encounters:  06/28/23 124/72  10/24/18 (!) 168/98  10/02/18 (!) 154/87   Wt Readings from Last 3 Encounters:  06/28/23 262 lb 4 oz (119 kg)  10/24/18 265 lb (120.2 kg)  10/02/18 266 lb (120.7 kg)    Physical Exam Vitals and nursing note reviewed.  Constitutional:      General: She is not in acute distress.    Appearance: Normal appearance.  HENT:     Head: Normocephalic.  Right Ear: Tympanic membrane, ear canal and external ear normal.     Left Ear: Tympanic membrane, ear canal and external ear normal.  Eyes:     Extraocular Movements: Extraocular movements intact.     Conjunctiva/sclera: Conjunctivae normal.     Pupils: Pupils are equal, round, and reactive to light.  Cardiovascular:     Rate and Rhythm: Normal rate and regular rhythm.     Heart sounds: Normal heart sounds.  Pulmonary:     Effort: Pulmonary effort is normal.     Breath sounds: Normal breath sounds. No wheezing.  Abdominal:     General: Bowel sounds are normal.  Musculoskeletal:     Right lower leg: No edema.     Left lower leg: No edema.  Neurological:     General: No focal deficit present.     Mental Status: She is alert and oriented to person, place, and time.  Psychiatric:        Mood and Affect: Mood normal.        Behavior: Behavior normal.      Results for orders placed or performed in visit on 06/28/23  HM MAMMOGRAPHY  Result Value Ref Range   HM Mammogram Self Reported Normal 0-4 Bi-Rad, Self Reported Normal  HM COLONOSCOPY  Result Value Ref Range   HM Colonoscopy See Report (in chart) See Report (in chart), Patient Reported

## 2023-06-29 LAB — COMPLETE METABOLIC PANEL WITH GFR
AG Ratio: 1.6 (calc) (ref 1.0–2.5)
ALT: 15 U/L (ref 6–29)
AST: 16 U/L (ref 10–35)
Albumin: 4.4 g/dL (ref 3.6–5.1)
Alkaline phosphatase (APISO): 70 U/L (ref 37–153)
BUN: 21 mg/dL (ref 7–25)
CO2: 27 mmol/L (ref 20–32)
Calcium: 10.4 mg/dL (ref 8.6–10.4)
Chloride: 105 mmol/L (ref 98–110)
Creat: 0.86 mg/dL (ref 0.50–1.05)
Globulin: 2.8 g/dL (ref 1.9–3.7)
Glucose, Bld: 95 mg/dL (ref 65–99)
Potassium: 4.4 mmol/L (ref 3.5–5.3)
Sodium: 138 mmol/L (ref 135–146)
Total Bilirubin: 0.5 mg/dL (ref 0.2–1.2)
Total Protein: 7.2 g/dL (ref 6.1–8.1)
eGFR: 75 mL/min/{1.73_m2} (ref >=60)

## 2023-06-29 LAB — IRON,TIBC AND FERRITIN PANEL
%SAT: 11 % — ABNORMAL LOW (ref 16–45)
Ferritin: 11 ng/mL — ABNORMAL LOW (ref 16–288)
Iron: 47 ug/dL (ref 45–160)
TIBC: 440 ug/dL (ref 250–450)

## 2023-06-29 LAB — CBC WITH DIFFERENTIAL/PLATELET
Absolute Lymphocytes: 2360 {cells}/uL (ref 850–3900)
Absolute Monocytes: 519 {cells}/uL (ref 200–950)
Basophils Absolute: 17 {cells}/uL (ref 0–200)
Basophils Relative: 0.3 %
Eosinophils Absolute: 120 {cells}/uL (ref 15–500)
Eosinophils Relative: 2.1 %
HCT: 39.1 % (ref 35.0–45.0)
Hemoglobin: 12.8 g/dL (ref 11.7–15.5)
MCH: 30 pg (ref 27.0–33.0)
MCHC: 32.7 g/dL (ref 32.0–36.0)
MCV: 91.6 fL (ref 80.0–100.0)
MPV: 12.7 fL — ABNORMAL HIGH (ref 7.5–12.5)
Monocytes Relative: 9.1 %
Neutro Abs: 2685 {cells}/uL (ref 1500–7800)
Neutrophils Relative %: 47.1 %
Platelets: 230 10*3/uL (ref 140–400)
RBC: 4.27 10*6/uL (ref 3.80–5.10)
RDW: 13.8 % (ref 11.0–15.0)
Total Lymphocyte: 41.4 %
WBC: 5.7 10*3/uL (ref 3.8–10.8)

## 2023-06-29 LAB — LIPID PANEL
Cholesterol: 249 mg/dL — ABNORMAL HIGH (ref <200)
HDL: 76 mg/dL (ref >=50)
LDL Cholesterol (Calc): 143 mg/dL — ABNORMAL HIGH
Non-HDL Cholesterol (Calc): 173 mg/dL — ABNORMAL HIGH (ref <130)
Total CHOL/HDL Ratio: 3.3 (calc) (ref <5.0)
Triglycerides: 165 mg/dL — ABNORMAL HIGH (ref <150)

## 2023-06-29 LAB — HEMOGLOBIN A1C
Hgb A1c MFr Bld: 5.4 %{Hb} (ref <5.7)
Mean Plasma Glucose: 108 mg/dL
eAG (mmol/L): 6 mmol/L

## 2023-06-29 LAB — TSH: TSH: 1.55 m[IU]/L (ref 0.40–4.50)

## 2023-06-30 ENCOUNTER — Encounter: Payer: Self-pay | Admitting: Family Medicine

## 2023-07-04 ENCOUNTER — Other Ambulatory Visit: Payer: Self-pay

## 2023-07-04 DIAGNOSIS — Z1231 Encounter for screening mammogram for malignant neoplasm of breast: Secondary | ICD-10-CM

## 2023-07-06 ENCOUNTER — Encounter: Payer: Self-pay | Admitting: Family Medicine

## 2023-07-08 ENCOUNTER — Other Ambulatory Visit: Payer: Self-pay

## 2023-07-08 ENCOUNTER — Other Ambulatory Visit: Payer: Self-pay | Admitting: Family Medicine

## 2023-07-08 MED ORDER — TIRZEPATIDE-WEIGHT MANAGEMENT 2.5 MG/0.5ML ~~LOC~~ SOLN
2.5000 mg | SUBCUTANEOUS | 0 refills | Status: DC
Start: 2023-07-08 — End: 2023-07-11

## 2023-07-11 ENCOUNTER — Other Ambulatory Visit: Payer: Self-pay

## 2023-07-11 DIAGNOSIS — Z1231 Encounter for screening mammogram for malignant neoplasm of breast: Secondary | ICD-10-CM

## 2023-07-12 DIAGNOSIS — D509 Iron deficiency anemia, unspecified: Secondary | ICD-10-CM | POA: Diagnosis not present

## 2023-07-13 ENCOUNTER — Encounter: Payer: Self-pay | Admitting: Family Medicine

## 2023-07-25 ENCOUNTER — Encounter: Payer: Self-pay | Admitting: Family Medicine

## 2023-07-25 DIAGNOSIS — D649 Anemia, unspecified: Secondary | ICD-10-CM | POA: Diagnosis not present

## 2023-07-25 DIAGNOSIS — D508 Other iron deficiency anemias: Secondary | ICD-10-CM | POA: Diagnosis not present

## 2023-07-25 MED ORDER — POTASSIUM CHLORIDE ER 10 MEQ PO TBCR
10.0000 meq | EXTENDED_RELEASE_TABLET | Freq: Every day | ORAL | 1 refills | Status: DC
Start: 2023-07-25 — End: 2023-08-18

## 2023-08-01 DIAGNOSIS — D508 Other iron deficiency anemias: Secondary | ICD-10-CM | POA: Diagnosis not present

## 2023-08-01 DIAGNOSIS — D649 Anemia, unspecified: Secondary | ICD-10-CM | POA: Diagnosis not present

## 2023-08-08 DIAGNOSIS — D649 Anemia, unspecified: Secondary | ICD-10-CM | POA: Diagnosis not present

## 2023-08-08 DIAGNOSIS — D508 Other iron deficiency anemias: Secondary | ICD-10-CM | POA: Diagnosis not present

## 2023-08-15 DIAGNOSIS — D649 Anemia, unspecified: Secondary | ICD-10-CM | POA: Diagnosis not present

## 2023-08-15 DIAGNOSIS — D508 Other iron deficiency anemias: Secondary | ICD-10-CM | POA: Diagnosis not present

## 2023-08-17 ENCOUNTER — Other Ambulatory Visit: Payer: Self-pay | Admitting: Family Medicine

## 2023-08-18 NOTE — Telephone Encounter (Signed)
 Requested medications are due for refill today.  no  Requested medications are on the active medications list.  yes  Last refill. 07/25/2023 #30 1 rf  Future visit scheduled.   yes  Notes to clinic.  Pt is requesting a 90 day supply.    Requested Prescriptions  Pending Prescriptions Disp Refills   potassium chloride (KLOR-CON) 10 MEQ tablet [Pharmacy Med Name: POTASSIUM CL ER 10 MEQ TABLET] 90 tablet 1    Sig: TAKE 1 TABLET BY MOUTH EVERY DAY     Endocrinology:  Minerals - Potassium Supplementation Failed - 08/18/2023  1:44 PM      Failed - Valid encounter within last 12 months    Recent Outpatient Visits           1 month ago Iron deficiency anemia, unspecified iron deficiency anemia type   [facility] Amadeo June, MD              Passed - K in normal range and within 360 days    Potassium  Date Value Ref Range Status  06/28/2023 4.4 3.5 - 5.3 mmol/L Final         Passed - Cr in normal range and within 360 days    Creat  Date Value Ref Range Status  06/28/2023 0.86 0.50 - 1.05 mg/dL Final

## 2023-08-22 DIAGNOSIS — D649 Anemia, unspecified: Secondary | ICD-10-CM | POA: Diagnosis not present

## 2023-08-22 DIAGNOSIS — D508 Other iron deficiency anemias: Secondary | ICD-10-CM | POA: Diagnosis not present

## 2023-09-07 ENCOUNTER — Encounter: Payer: Self-pay | Admitting: Family Medicine

## 2023-10-04 DIAGNOSIS — Z1231 Encounter for screening mammogram for malignant neoplasm of breast: Secondary | ICD-10-CM | POA: Diagnosis not present

## 2023-10-04 DIAGNOSIS — M810 Age-related osteoporosis without current pathological fracture: Secondary | ICD-10-CM | POA: Diagnosis not present

## 2023-10-04 DIAGNOSIS — Z780 Asymptomatic menopausal state: Secondary | ICD-10-CM | POA: Diagnosis not present

## 2023-10-04 LAB — HM MAMMOGRAPHY

## 2023-10-05 ENCOUNTER — Ambulatory Visit: Payer: BC Managed Care – PPO | Admitting: Family Medicine

## 2023-10-05 ENCOUNTER — Encounter: Payer: Self-pay | Admitting: Family Medicine

## 2023-10-05 VITALS — BP 126/88 | HR 75 | Temp 98.7°F | Ht 69.75 in | Wt 256.0 lb

## 2023-10-05 DIAGNOSIS — D509 Iron deficiency anemia, unspecified: Secondary | ICD-10-CM

## 2023-10-05 DIAGNOSIS — E7800 Pure hypercholesterolemia, unspecified: Secondary | ICD-10-CM | POA: Diagnosis not present

## 2023-10-05 DIAGNOSIS — Z800 Family history of malignant neoplasm of digestive organs: Secondary | ICD-10-CM

## 2023-10-05 DIAGNOSIS — Z23 Encounter for immunization: Secondary | ICD-10-CM | POA: Diagnosis not present

## 2023-10-05 DIAGNOSIS — Q790 Congenital diaphragmatic hernia: Secondary | ICD-10-CM

## 2023-10-05 DIAGNOSIS — Z0000 Encounter for general adult medical examination without abnormal findings: Secondary | ICD-10-CM

## 2023-10-05 DIAGNOSIS — Z0001 Encounter for general adult medical examination with abnormal findings: Secondary | ICD-10-CM | POA: Diagnosis not present

## 2023-10-05 DIAGNOSIS — Z809 Family history of malignant neoplasm, unspecified: Secondary | ICD-10-CM | POA: Diagnosis not present

## 2023-10-05 DIAGNOSIS — K449 Diaphragmatic hernia without obstruction or gangrene: Secondary | ICD-10-CM

## 2023-10-05 DIAGNOSIS — Z1211 Encounter for screening for malignant neoplasm of colon: Secondary | ICD-10-CM

## 2023-10-05 DIAGNOSIS — Z6837 Body mass index (BMI) 37.0-37.9, adult: Secondary | ICD-10-CM

## 2023-10-05 DIAGNOSIS — Z1212 Encounter for screening for malignant neoplasm of rectum: Secondary | ICD-10-CM

## 2023-10-05 NOTE — Progress Notes (Signed)
 Complete physical exam  Assessment & Plan:    Routine Health Maintenance and Physical Exam Discussed health benefits of physical activity, and encouraged her to engage in regular exercise appropriate for her age and condition.  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        Subjective:  Patient ID: Natalie Myers, female    DOB: [birth year]  Age: 68 y.o. MRN: [id number] No chief complaint on file.   Natalie Myers is a 68 y.o. female who presents today for a complete physical exam. Colonoscopy-2020 in [facility], repeat in 2025 due to family hx of colon cancer and previous polyps  DEXA scan- done yesterday and mammogram done too Tetanus-2020 Shingrix-UTD Pneumococcal 20- not yet, will get today Hyperlipidemia-not taking chol medication Aware of need for diet control, exercise and healthy eating.  Iron deficiency anemia- pt sees Hematology re this. Pt has required more iron infusions the past year. Pt does not feel that subsequent infusions have been as helpful as the initial one Family history of multiple cancers-family history of renal cell carcinoma, uterine cancer, breast cancer and colon cancer.  Patient also discovered that a niece was recently diagnosed with rectal carcinoma.  Patient has never had genetic testing but is interested in having a genetic  consult to see if she warrants further testing.  Patient is up-to-date on mammogram and colon cancer screening. 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  Patient did see pulmonary and was prescribed an inhaler but does not think that that helps.  Patient has not seen a general surgeon regarding evaluation and whether she would benefit from surgical intervention. Obesity- her insurance will not cover Wegovy  or Zepbound. I wrote an appeal letter to get it approved but was denied. Patient works out about once per week and doing stairs at work. Pt is eating healthier overall. Husband cooks at home The 10-year ASCVD risk score (Arnett DK, et al., 2019) is: 5.4%   Values used to calculate the score:     Age: 53 years     Sex: Female     Is Non-Hispanic African American: No     Diabetic: No     Tobacco smoker: No     Systolic Blood Pressure: 126 mmHg     Is BP treated: No     HDL Cholesterol: 74 mg/dL     Total Cholesterol: 249 mg/dL  Health Maintenance  Topic Date Due   HIV Screening  Never done   Hepatitis C Screening  Never done   COVID-19 Vaccine (3 - Pfizer risk series) 10/21/2023*   Pap with HPV screening  01/05/2024*   Flu Shot  12/02/2023   Mammogram  09/27/2024   DTaP/Tdap/Td vaccine (4 - Td or Tdap) 06/26/2028   Colon Cancer Screening  01/14/2029   Pneumonia Vaccine  Completed   DEXA scan (bone density measurement)  Completed   Zoster (Shingles) Vaccine  Completed   HPV Vaccine  Aged Out   Meningitis B Vaccine  Aged Out  *Topic was postponed. The date shown is not the original due date.    Most recent fall risk assessment:    10/05/2023    8:11 AM  Fall Risk   Falls in the past year? 0  Number falls in past yr: 0  Injury with Fall? 0     Most recent depression screenings:    10/05/2023    8:11 AM 06/28/2023    2:12 PM  PHQ 2/9 Scores  PHQ - 2 Score 1 0  PHQ-  9 Score 7 0      The 10-year ASCVD risk score (Arnett DK, et al., 2019) is: 5.3%   Values used to calculate the score:     Age: 41 years     Sex: Female     Is Non-Hispanic African American: No     Diabetic: No     Tobacco smoker: No     Systolic Blood Pressure: 126 mmHg     Is BP treated: No     HDL Cholesterol: 76 mg/dL     Total Cholesterol: 249 mg/dL  Past Surgical History:  Procedure Laterality Date   ABDOMINAL HYSTERECTOMY     CESAREAN SECTION     1992, 1994   HYSTERECTOMY ABDOMINAL WITH SALPINGECTOMY     TONSILECTOMY, ADENOIDECTOMY, BILATERAL MYRINGOTOMY AND TUBES     TUBAL LIGATION  1992   Social History   Tobacco Use   Smoking status: Never   Smokeless tobacco: Never  Vaping Use   Vaping status: Never Used  Substance Use Topics   Alcohol use: Never   Drug use: Never   Social History   Socioeconomic History   Marital status: Married    Spouse name: Not on file   Number of children: Not on file   Years of education: Not on file   Highest education level: Bachelor's degree (e.g., BA, AB, BS)  Occupational History   Not on file  Tobacco Use   Smoking status: Never   Smokeless tobacco: Never  Vaping Use   Vaping status: Never Used  Substance and Sexual Activity   Alcohol use: Never   Drug use: Never   Sexual activity: Not Currently    Birth control/protection: None  Other Topics Concern   Not on file  Social History Narrative   Patient working full time.    Social Drivers of Corporate investment banker Strain: Low Risk  (10/03/2023)   Overall Financial Resource Strain (CARDIA)    Difficulty of Paying Living Expenses: Not very hard  Food Insecurity: No Food Insecurity (10/03/2023)   Hunger Vital Sign    Worried About Running Out of Food in the Last Year: Never true    Ran Out of Food in the Last Year: Never true  Transportation Needs: No Transportation Needs (10/03/2023)   PRAPARE - Administrator, Civil Service (Medical): No    Lack  of Transportation (Non-Medical): No  Physical Activity: Insufficiently Active (10/03/2023)   Exercise Vital Sign    Days of Exercise per Week: 2 days    Minutes of Exercise per Session: 40 min  Stress: No Stress Concern Present (10/03/2023)   [employer]  of Occupational Health - Occupational Stress Questionnaire    Feeling of Stress : Only a little  Social Connections: Socially Integrated (10/03/2023)   Social Connection and Isolation Panel [NHANES]    Frequency of Communication with Friends and Family: Twice a week    Frequency of Social Gatherings with Friends and Family: Once a week    Attends Religious Services: More than 4 times per year    Active Member of [employer] or Organizations: Yes    Attends Engineer, structural: More than 4 times per year    Marital Status: Married  Catering manager Violence: Not on file   Family History  Problem Relation Age of Onset   Uterine cancer Mother    Skin cancer Mother    Endometrial cancer Mother    Depression Mother    Hyperlipidemia Mother    Hypertension Mother    Head & neck cancer Father    Kidney cancer Father    Cancer - Colon Father    Intellectual disability Sister    Learning disabilities Sister    Diabetes Maternal Grandfather    Polycystic ovary syndrome Daughter    Anxiety disorder Son    Depression Son    Breast cancer Cousin    Rectal cancer Niece    Ovarian cancer Neg Hx    Allergies  Allergen Reactions   Tape Rash    Adhesive tape     Patient Care Team: Amadeo June, MD as PCP - General (Family Medicine)   Outpatient Medications Prior to Visit  Medication Sig   potassium chloride  (KLOR-CON ) 10 MEQ tablet TAKE 1 TABLET BY MOUTH EVERY DAY   [DISCONTINUED] iron polysaccharides (NIFEREX) 150 MG capsule Take 1 tablet by mouth daily.   [DISCONTINUED] pantoprazole (PROTONIX) 40 MG tablet Take 40 mg by mouth every morning.   [DISCONTINUED] ZEPBOUND 2.5 MG/0.5ML injection vial INJECT 2.5 MG SUBCUTANEOUSLY  WEEKLY   No facility-administered medications prior to visit.    ROS     Objective:    BP 126/88   Pulse 75   Temp 98.7 F (37.1 C)   Ht 5' 9.75" (1.772 m)   Wt 256 lb (116.1 kg)   SpO2 99%   BMI 37.00 kg/m  BP Readings from Last 3 Encounters:  10/05/23 126/88  06/28/23 124/72  10/24/18 (!) 168/98   Wt Readings from Last 3 Encounters:  10/05/23 256 lb (116.1 kg)  06/28/23 262 lb 4 oz (119 kg)  10/24/18 265 lb (120.2 kg)    Physical Exam Vitals and nursing note reviewed.  Constitutional:      Appearance: Normal appearance.  HENT:     Head: Normocephalic.     Right Ear: Tympanic membrane, ear canal and external ear normal.     Left Ear: Tympanic membrane, ear canal and external ear normal.     Nose: Nose normal.     Mouth/Throat:     Mouth: Mucous membranes are moist.     Pharynx: Oropharynx is clear.  Eyes:     Extraocular Movements: Extraocular movements intact.     Conjunctiva/sclera: Conjunctivae normal.     Pupils: Pupils are equal, round, and reactive to light.  Neck:     Thyroid: No thyroid mass, thyromegaly or thyroid tenderness.  Cardiovascular:     Rate and Rhythm: Normal rate and regular rhythm.     Heart sounds: Normal heart sounds.  Pulmonary:     Effort: Pulmonary effort is normal.     Breath sounds: Normal breath sounds.  Chest:  Breasts:    Breasts are symmetrical.     Right: Normal. No inverted nipple, mass or nipple discharge.     Left: Normal. No inverted nipple, mass or nipple discharge.  Abdominal:     General: Bowel sounds are normal.     Palpations: Abdomen is soft.     Tenderness: There is no abdominal tenderness. There is no guarding.  Musculoskeletal:        General: No tenderness. Normal range of motion.     Cervical back: Normal range of motion and neck supple.     Right lower leg: No edema.     Left lower leg: No edema.  Lymphadenopathy:     Upper Body:     Right upper body: No supraclavicular or axillary adenopathy.      Left upper body: No supraclavicular or axillary adenopathy.  Skin:    General: Skin is warm and dry.     Findings: Lesion present.     Comments: Has numerous pigmented nevi noted over arms, torso and legs.   Neurological:     General: No focal deficit present.     Mental Status: She is alert and oriented to person, place, and time. Mental status is at baseline.     Cranial Nerves: No cranial nerve deficit.     Sensory: No sensory deficit.     Motor: No weakness.     Coordination: Coordination is intact. Romberg sign negative. Coordination normal. Finger-Nose-Finger Test normal.     Gait: Gait normal.     Deep Tendon Reflexes: Reflexes are normal and symmetric. Reflexes normal.  Psychiatric:        Mood and Affect: Mood normal.        Behavior: Behavior normal.        Thought Content: Thought content normal.        Judgment: Judgment normal.      No results found for any visits on 10/05/23.      Amadeo June, MD

## 2023-10-06 ENCOUNTER — Other Ambulatory Visit (HOSPITAL_COMMUNITY): Payer: Self-pay

## 2023-10-06 ENCOUNTER — Ambulatory Visit: Payer: Self-pay | Admitting: Family Medicine

## 2023-10-06 LAB — IRON,TIBC AND FERRITIN PANEL
%SAT: 22 % (ref 16–45)
Ferritin: 61 ng/mL (ref 16–288)
Iron: 84 ug/dL (ref 45–160)
TIBC: 376 ug/dL (ref 250–450)

## 2023-10-06 LAB — LIPID PANEL
Cholesterol: 249 mg/dL — ABNORMAL HIGH (ref <200)
HDL: 74 mg/dL (ref >=50)
LDL Cholesterol (Calc): 151 mg/dL — ABNORMAL HIGH
Non-HDL Cholesterol (Calc): 175 mg/dL — ABNORMAL HIGH (ref <130)
Total CHOL/HDL Ratio: 3.4 (calc) (ref <5.0)
Triglycerides: 121 mg/dL (ref <150)

## 2023-10-07 ENCOUNTER — Encounter: Payer: Self-pay | Admitting: Family Medicine

## 2023-10-11 ENCOUNTER — Encounter: Payer: Self-pay | Admitting: Family Medicine

## 2023-10-12 ENCOUNTER — Encounter: Payer: Self-pay | Admitting: Family Medicine

## 2023-10-13 ENCOUNTER — Encounter: Payer: Self-pay | Admitting: Family Medicine

## 2023-10-27 DIAGNOSIS — E6609 Other obesity due to excess calories: Secondary | ICD-10-CM | POA: Diagnosis not present

## 2023-10-27 DIAGNOSIS — Z6835 Body mass index (BMI) 35.0-35.9, adult: Secondary | ICD-10-CM | POA: Diagnosis not present

## 2023-10-27 DIAGNOSIS — Q790 Congenital diaphragmatic hernia: Secondary | ICD-10-CM | POA: Diagnosis not present

## 2023-12-05 DIAGNOSIS — D649 Anemia, unspecified: Secondary | ICD-10-CM | POA: Diagnosis not present

## 2023-12-05 DIAGNOSIS — K219 Gastro-esophageal reflux disease without esophagitis: Secondary | ICD-10-CM | POA: Diagnosis not present

## 2023-12-05 DIAGNOSIS — E66812 Obesity, class 2: Secondary | ICD-10-CM | POA: Diagnosis not present

## 2023-12-05 DIAGNOSIS — Q790 Congenital diaphragmatic hernia: Secondary | ICD-10-CM | POA: Diagnosis not present

## 2023-12-07 ENCOUNTER — Other Ambulatory Visit (HOSPITAL_COMMUNITY): Payer: Self-pay | Admitting: Surgery

## 2023-12-07 DIAGNOSIS — Q790 Congenital diaphragmatic hernia: Secondary | ICD-10-CM

## 2023-12-07 DIAGNOSIS — E66812 Obesity, class 2: Secondary | ICD-10-CM

## 2023-12-07 DIAGNOSIS — K219 Gastro-esophageal reflux disease without esophagitis: Secondary | ICD-10-CM

## 2023-12-08 ENCOUNTER — Ambulatory Visit (HOSPITAL_COMMUNITY)
Admission: RE | Admit: 2023-12-08 | Discharge: 2023-12-08 | Disposition: A | Discharge status: Home / Self Care | Source: Ambulatory Visit | Attending: Surgery | Admitting: Surgery

## 2023-12-08 DIAGNOSIS — K219 Gastro-esophageal reflux disease without esophagitis: Secondary | ICD-10-CM | POA: Insufficient documentation

## 2023-12-08 DIAGNOSIS — E66812 Obesity, class 2: Secondary | ICD-10-CM | POA: Diagnosis not present

## 2023-12-08 DIAGNOSIS — K449 Diaphragmatic hernia without obstruction or gangrene: Secondary | ICD-10-CM | POA: Diagnosis not present

## 2023-12-08 DIAGNOSIS — Z01818 Encounter for other preprocedural examination: Secondary | ICD-10-CM | POA: Diagnosis not present

## 2023-12-08 DIAGNOSIS — Q790 Congenital diaphragmatic hernia: Secondary | ICD-10-CM | POA: Diagnosis not present

## 2023-12-08 DIAGNOSIS — E6609 Other obesity due to excess calories: Secondary | ICD-10-CM | POA: Insufficient documentation

## 2023-12-08 DIAGNOSIS — Z6835 Body mass index (BMI) 35.0-35.9, adult: Secondary | ICD-10-CM | POA: Insufficient documentation

## 2023-12-09 DIAGNOSIS — D649 Anemia, unspecified: Secondary | ICD-10-CM | POA: Diagnosis not present

## 2023-12-09 DIAGNOSIS — R6889 Other general symptoms and signs: Secondary | ICD-10-CM | POA: Diagnosis not present

## 2023-12-09 DIAGNOSIS — Z6838 Body mass index (BMI) 38.0-38.9, adult: Secondary | ICD-10-CM | POA: Diagnosis not present

## 2023-12-09 DIAGNOSIS — D509 Iron deficiency anemia, unspecified: Secondary | ICD-10-CM | POA: Diagnosis not present

## 2023-12-09 DIAGNOSIS — Z79899 Other long term (current) drug therapy: Secondary | ICD-10-CM | POA: Diagnosis not present

## 2023-12-09 DIAGNOSIS — E66812 Obesity, class 2: Secondary | ICD-10-CM | POA: Diagnosis not present

## 2023-12-09 DIAGNOSIS — E6609 Other obesity due to excess calories: Secondary | ICD-10-CM | POA: Diagnosis not present

## 2023-12-20 ENCOUNTER — Ambulatory Visit (INDEPENDENT_AMBULATORY_CARE_PROVIDER_SITE_OTHER): Payer: Self-pay | Admitting: Licensed Clinical Social Worker

## 2023-12-20 DIAGNOSIS — F4320 Adjustment disorder, unspecified: Secondary | ICD-10-CM

## 2023-12-20 NOTE — Progress Notes (Signed)
 Virtual Visit via Video Note  I connected with Natalie Myers on 12/20/23 at  5:00 PM EDT by a video enabled telemedicine application and verified that I am speaking with the correct person using two identifiers.  Location: Patient: virtual, home office, [place] Provider: virtual, home office, [place]   I discussed the limitations of evaluation and management by telemedicine and the availability of in person appointments. The patient expressed understanding and agreed to proceed.   I discussed the assessment and treatment plan with the patient. The patient was provided an opportunity to ask questions and all were answered. The patient agreed with the plan and demonstrated an understanding of the instructions.   The patient was advised to call back or seek an in-person evaluation if the symptoms worsen or if the condition fails to improve as anticipated.    Comprehensive Clinical Assessment (CCA) Note  12/20/2023 Natalie Myers [id number]  Chief Complaint:  Chief Complaint  Patient presents with   BARIATRIC SCREENING   Visit Diagnosis:  Encounter Diagnosis  Name Primary?   Adjustment disorder, unspecified type Yes   Disposition:  Clinician sees no significant psychological factors that would hinder the success of bariatric surgery at time of assessment. Clinician supports patient candidacy for Bariatric Surgery.   Patient reports realistic expectations post surgery, is aware of the pre and post surgical process, client reports that behavioral health diagnosis(es) are stable at time of assessment, client reports positive pre and post surgical support system, and client reports motivation to make positive change.      CCA Biopsychosocial Intake/Chief Complaint:  BARIATRIC SCREENING  Current Symptoms/Problems: Natalie Myers is a 68 y.o. year old adult patient reporting to [facility] for preliminary screening to determine bariatric surgery eligibility. Patient reports that they have tried several  weight loss interventions in the past, including increased physical activity, dietary changes, and medications..  Natalie Myers reports current medical concerns/medical history of hernia, vomiting, migraines, anemia, arthritis, torn meniscus in knee, situational anxiety and depression.  Patient reports situationally-triggered history of depression, anxiety, or other mental health disorders. Pt reports she was given medication to manage depression many years ago that triggered some suicidal ideation. Pt stopped the medication at that time and has not taken anything since and feels she manages any of her triggered symptoms successfully.  Natalie Myers denies SI, HI, or perceptual disturbances at time of assessment. Patient denies substance use issues at time of assessment. Natalie Myers  reports that they are motivated to make positive changes to contribute to improved wellness and are seeking bariatric weight loss surgery as an intervention to support wellness goals.   Patient Reported Schizophrenia/Schizoaffective Diagnosis in Past: No   Strengths: Patient reports that she has good family support, positive work environment and ability to set positive goals for self  Preferences: Due to unsuccessful weight-loss interventions in the past, the patient is seeking bariatric weight loss surgery  Abilities: Patient is able to make positive behavior choices and has the ability to work full-time, patient also has ability to engage in most physical activities   Type of Services Patient Feels are Needed: Patient seeking bariatric weight loss surgery   Initial Clinical Notes/Concerns: Patient reports that she briefly took medication to manage anxiety and depression symptoms in the past that triggered suicidal ideation--patient stopped the medication and is able to manage symptoms currently on her own   Mental Health Symptoms Depression:  Hopelessness; Weight gain/loss (situational hopelessness; lost 15 lbs 3  months  ago)   Duration of Depressive symptoms: Greater than two weeks   Mania:  None   Anxiety:   Worrying   Psychosis:  None   Duration of Psychotic symptoms: No data recorded  Trauma:  Re-experience of traumatic event (occasional nightmare)   Obsessions:  None   Compulsions:  None   Inattention:  None   Hyperactivity/Impulsivity:  None   Oppositional/Defiant Behaviors:  None   Emotional Irregularity:  None   Other Mood/Personality Symptoms:  None reported    Mental Status Exam Appearance and self-care  Stature:  Average   Weight:  Obese   Clothing:  Neat/clean   Grooming:  Normal   Cosmetic use:  None   Posture/gait:  Normal   Motor activity:  Not Remarkable   Sensorium  Attention:  Normal   Concentration:  Normal   Orientation:  X5   Recall/memory:  Normal   Affect and Mood  Affect:  Appropriate   Mood:  Other (Comment) (Within normal limits)   Relating  Eye contact:  Normal   Facial expression:  Responsive   Attitude toward examiner:  Cooperative   Thought and Language  Speech flow: Clear and Coherent   Thought content:  Appropriate to Mood and Circumstances   Preoccupation:  None   Hallucinations:  None   Organization: Coherent, goal directed  [employer] of Knowledge:  Good   Intelligence:  Above Average   Abstraction:  Normal   Judgement:  Good   Reality Testing:  Realistic   Insight:  Good   Decision Making:  Normal   Social Functioning  Social Maturity:  Responsible   Social Judgement:  Normal   Stress  Stressors:  Grief/losses; Illness (lost 6 people that were co workers/family members)   Coping Ability:  Normal   Skill Deficits:  None   Supports:  Family; Friends/Service system     Religion: Religion/Spirituality Are You A Religious Person?: No How Might This Affect Treatment?: no barriers to treatment  Leisure/Recreation: Leisure / Recreation Do You Have Hobbies?: Yes Leisure  and Hobbies: enjoys reading, work puzzles, knit/crochet, do cross stitch, swim, go to gym, hiking  Exercise/Diet: Exercise/Diet Do You Exercise?: Yes What Type of Exercise Do You Do?: Hiking, Other (Comment), Weight Training How Many Times a Week Do You Exercise?: 1-3 times a week Have You Gained or Lost A Significant Amount of Weight in the Past Six Months?: Yes-Lost Number of Pounds Lost?: 15 Do You Follow a Special Diet?: Yes Type of Diet: I try to watch what i eat. limiting carbs, prioritizing protein and veggies Do You Have Any Trouble Sleeping?: No   CCA Employment/Education Employment/Work Situation: Employment / Work Situation Employment Situation: Employed Where is Patient Currently Employed?: [employer] office Are You Satisfied With Your Job?: Yes Work Stressors: none Patient's Job has Been Impacted by Current Illness: No What is the Longest Time Patient has Held a Job?: Patient reports that she worked for [employer] for many years until it was acquired by another company Has Patient ever Been in Equities trader?: No  Education: Education Is Patient Currently Attending School?: No Last Grade Completed: 12 Name of High School: [employer] Did Garment/textile technologist From [employer]?: Yes Did Theme park manager?: Yes What Type of College Degree Do you Have?: Bachelors degree Did You Attend Graduate School?: No Did You Have Any Special Interests In School?: none Did You Have An Individualized Education Program (IIEP): No Did You Have Any Difficulty At [employer]?:  No Patient's Education Has Been Impacted by Current Illness: No   CCA Family/Childhood History Family and Relationship History: Family history Marital status: Married Number of Years Married: 35 What types of issues is patient dealing with in the relationship?: No issues currently with relationship Additional relationship information: Patient reports that her husband is a very good support system for  her What is your sexual orientation?: Heterosexual Does patient have children?: Yes How many children?: 2 How is patient's relationship with their children?: positive relationships with children  Childhood History:  Childhood History By whom was/is the patient raised?: Both parents Additional childhood history information: pt lived with parents and sisters. Father traveled a lot--kids grew up around mom mostly Description of patient's relationship with caregiver when they were a child: Patient reports positive relationship with both parents as a child Patient's description of current relationship with people who raised him/her: Patient has positive relationship with mother currently.  Patient reports that her father is deceased How were you disciplined when you got in trouble as a child/adolescent?: fairly Does patient have siblings?: Yes Number of Siblings: 4 Description of patient's current relationship with siblings: Patient reports that she currently has positive relationships with 4 sisters Did patient suffer any verbal/emotional/physical/sexual abuse as a child?: No Did patient suffer from severe childhood neglect?: No Has patient ever been sexually abused/assaulted/raped as an adolescent or adult?: No Was the patient ever a victim of a crime or a disaster?: No Witnessed domestic violence?: No Has patient been affected by domestic violence as an adult?: No  Child/Adolescent Assessment:     CCA Substance Use Alcohol/Drug Use: Alcohol / Drug Use Pain Medications: SEE MAR Prescriptions: SEE MAR Over the Counter: SEE MAR History of alcohol / drug use?: No history of alcohol / drug abuse Longest period of sobriety (when/how long): ONGOING Negative Consequences of Use:  (NONE) Withdrawal Symptoms: None     ASAM's:  Six Dimensions of Multidimensional Assessment  Dimension 1:  Acute Intoxication and/or Withdrawal Potential:   Dimension 1:  Description of individual's past and  current experiences of substance use and withdrawal: NO HISTORY OF SUBSTANCE USE  Dimension 2:  Biomedical Conditions and Complications:   Dimension 2:  Description of patient's biomedical conditions and  complications: NONE  Dimension 3:  Emotional, Behavioral, or Cognitive Conditions and Complications:  Dimension 3:  Description of emotional, behavioral, or cognitive conditions and complications: NONE  Dimension 4:  Readiness to Change:  Dimension 4:  Description of Readiness to Change criteria: NONE  Dimension 5:  Relapse, Continued use, or Continued Problem Potential:  Dimension 5:  Relapse, continued use, or continued problem potential critiera description: NONE  Dimension 6:  Recovery/Living Environment:  Dimension 6:  Recovery/Iiving environment criteria description: NONE  ASAM Severity Score: ASAM's Severity Rating Score: 0  ASAM Recommended Level of Treatment: ASAM Recommended Level of Treatment: Level I Outpatient Treatment   Substance use Disorder (SUD) Substance Use Disorder (SUD)  Checklist Symptoms of Substance Use:  (NONE)  Recommendations for Services/Supports/Treatments: Recommendations for Services/Supports/Treatments Recommendations For Services/Supports/Treatments: Other (Comment) (PSYCHOTHERAPY PRN)  DSM5 Diagnoses: Patient Active Problem List   Diagnosis Date Noted   Postmenopausal 06/28/2023   Visit for screening mammogram 06/28/2023   Obesity (BMI 30-39.9) 06/28/2023   Class 2 obesity due to excess calories without serious comorbidity with body mass index (BMI) of 38.0 to 38.9 in adult 06/07/2023   Dysphagia 06/07/2023   Mild intermittent asthma without complication 06/07/2023   Hiatal hernia 06/07/2023   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 Date of Interview:  12/20/23 Gender:  F   Date of Report : 12/20/23 Purpose:   Bariatric/Weight-loss Surgery (pre-operative evaluation)    Assessment Instruments:  DSM-5-TR Self-Rated Level 1 Cross-Cutting Symptom Measure--Adult Severity Measure for Generalized Anxiety Disorder--Adult EAT-26 (Eating Attitudes Test) SSS-8 (Somatic Symptom Scale)  Chief Complaint: BARIATRIC SCREENING  Client Background: Patient is a 68 year old female seeking weight loss surgery. Patient has college education and is currently working in a Equities trader. .  Patients marital status is married for 35 years.  Patient has a son, daughter, and grandchildren.   The patient is 5 feet 9 inches tall and 255 lbs., reflecting a BMI of 37.11 classifying patient in the obese range and at further risk of co-morbid diseases.  Tobacco Use: Patient denies tobacco use.   PATIENT BEHAVIORAL ASSESSMENT SCORES  Personal History of Mental Illness: Patient denies current treatment for depression and anxiety.  Patient reports that she feels that she can manage situationally triggered anxiety and depression symptoms by using her own coping skills.  Mental Status Examination: Patient was oriented x5 (person, place, situation, time, and object). Patient was appropriately groomed, and neatly dressed. Patient was alert, engaged, pleasant, and cooperative. Patient denies suicidal and homicidal ideations or any perceptual disturbances. Patient denies self-injury.   DSM-5-TR Self-Rated Level 1 Cross-Cutting Symptom Measure--Adult: Patient completed 23-item questionnaire assessing symptoms related to depression, anger, mania, anxiety, somatic symptoms, suicidal ideation, psychosis, insomnia, memory concerns, repetitive behaviors,  dissociation, personality functioning and substance use. Natalie Myers scored 2 in mild depression and mild anxiety domain.   Severity Measure for Generalized Anxiety Disorder--Adult: Patient completed a 10-item  scale. Total scores can range from 0 to 40. A raw score is calculated by summing the answer to each question, and an average total score is achieved by dividing the raw score by the number of items (e.g., 10).Natalie Myers had a total raw score of 4 out of 40 which was divided by the total number of questions answered (10) to get an average score of 0.1 which indicates no significant anxiety.   EAT-26: The EAT-26 is a twenty-six-question screening tool to identify symptoms of dieting behaviors, bulimia, food preoccupation and oral control.  Natalie Myers scored 2 out of 26. Scores below a 20 are considered not meeting criteria for disordered eating. Patient denies inducing vomiting, or intentional meal skipping. Patient denies binge eating behaviors. Patient denies laxative abuse. Patient does not meet criteria for a DSM-V eating disorder.  SSS-8: The SSS-8, or Somatic Symptom Scale-8, is a brief self-report questionnaire used to assess the perceived burden of common somatic (physical) symptoms.  (SSS-8) is scored by summing the responses to eight items, each rated on a 5-point Likert scale from 0 (Not at all) to 4 (Very much). Total scores range from 0 to 32, with higher scores indicating greater somatic symptom burden. Scores are categorized into five severity levels: no/minimal, low, medium, high, and very high somatic symptom burden. Natalie Myers scored 6 out of 32, which indicates low score.  Conclusion & Recommendations:   Health history and current assessment reflect that patient is suitable to be a candidate for bariatric surgery. Patient understands the procedure, the risks associated with it, and the importance of post-operative holistic care (Physical, Spiritual/Values, Relationships, and  Mental/Emotional health) with access to resources for support as needed. The patient has made an informed decision to proceed with procedure. The patient is motivated and expressed understanding of the post-surgical requirements. Patient's psychological assessment will be valid from today's date for 6 months (06/21/2024). After that date, a follow-up appointment will be needed to re-evaluate the patient's psychological status.   Clinician sees no significant psychological factors that would hinder the success of bariatric surgery at time of assessment. Clinician supports patient candidacy for Bariatric Surgery.   Tawni JONELLE Brisker, MSW, LCSW Licensed Clinical Social [facility]     Referrals to Alternative Service(s): Referred to Alternative Service(s):   Place:   Date:   Time:    Referred to Alternative Service(s):   Place:   Date:   Time:    Referred to Alternative Service(s):   Place:   Date:   Time:    Referred to Alternative Service(s):   Place:   Date:   Time:      Collaboration of Care: Other clinician releases patient back to bariatric team members and encouraged pt to follow all ongoing  recommendations and to seek psychotherapy services PRN  Patient/Guardian was advised Release of Information must be obtained prior to any record release in order to collaborate their care with an outside provider. Patient/Guardian was advised if they have not already done so to contact the registration department to sign all necessary forms in order for us  to release information regarding their care.   Consent: Patient/Guardian gives verbal consent for treatment and assignment of benefits for services provided during this visit. Patient/Guardian expressed understanding and agreed to proceed.   Jafari Mckillop R Hiroyuki Ozanich, LCSW

## 2023-12-26 ENCOUNTER — Ambulatory Visit: Admitting: Dietician

## 2024-01-11 ENCOUNTER — Encounter: Payer: Self-pay | Admitting: Genetic Counselor

## 2024-01-11 ENCOUNTER — Inpatient Hospital Stay: Attending: Genetic Counselor | Admitting: Genetic Counselor

## 2024-01-11 ENCOUNTER — Inpatient Hospital Stay

## 2024-01-11 DIAGNOSIS — Z1379 Encounter for other screening for genetic and chromosomal anomalies: Secondary | ICD-10-CM | POA: Diagnosis not present

## 2024-01-11 DIAGNOSIS — Z808 Family history of malignant neoplasm of other organs or systems: Secondary | ICD-10-CM | POA: Diagnosis not present

## 2024-01-11 DIAGNOSIS — Z800 Family history of malignant neoplasm of digestive organs: Secondary | ICD-10-CM | POA: Insufficient documentation

## 2024-01-11 DIAGNOSIS — Z8049 Family history of malignant neoplasm of other genital organs: Secondary | ICD-10-CM

## 2024-01-11 LAB — GENETIC SCREENING ORDER

## 2024-01-11 NOTE — Progress Notes (Addendum)
 REFERRING PROVIDER: Aletha Bene, MD [address] [place],  [place] [address]  PRIMARY PROVIDER:  Aletha Bene, MD  PRIMARY REASON FOR VISIT:  1. Family history of malignant neoplasm of genital organ   2. Family history of malignant neoplasm of gastrointestinal tract   3. Family hx of melanoma      HISTORY OF PRESENT ILLNESS:   Ms. Golebiewski, a 68 y.o. female, was seen for a [place] cancer genetics consultation at the request of Dr. Aletha due to a family history of cancer.  Ms. Shivley presents to clinic today to discuss the possibility of a hereditary predisposition to cancer, to discuss genetic testing, and to further clarify her future cancer risks, as well as potential cancer risks for family members.   Ms. Recktenwald is a 68 y.o. female with a personal history of skin cancer on her forehead, resected.   RELEVANT MEDICAL HISTORY:  Menarche was at age 11.  First live birth at age 68.  Ovaries intact: no.  Uterus intact: no. Hysterectomy/BSO at 42 Menopausal status: postmenopausal.  HRT use: 1 years. Colonoscopy: yes; last 2020, couple of polyps. Repeats every 5 years. Mammogram within the last year: yes. Number of breast biopsies: 0.   Past Medical History:  Diagnosis Date   Anemia    Arthritis    Depression    GERD (gastroesophageal reflux disease)     Past Surgical History:  Procedure Laterality Date   ABDOMINAL HYSTERECTOMY     CESAREAN SECTION     1992, 1994   HYSTERECTOMY ABDOMINAL WITH SALPINGECTOMY     TONSILECTOMY, ADENOIDECTOMY, BILATERAL MYRINGOTOMY AND TUBES     TUBAL LIGATION  1992    Social History   Socioeconomic History   Marital status: Married    Spouse name: Not on file   Number of children: Not on file   Years of education: Not on file   Highest education level: Bachelor's degree (e.g., BA, AB, BS)  Occupational History   Not on file  Tobacco Use   Smoking status: Never   Smokeless tobacco: Never  Vaping Use   Vaping status: Never  Used  Substance and Sexual Activity   Alcohol use: Never   Drug use: Never   Sexual activity: Not Currently    Birth control/protection: None  Other Topics Concern   Not on file  Social History Narrative   Patient working full time.    Social Drivers of Corporate investment banker Strain: Low Risk  (10/03/2023)   Overall Financial Resource Strain (CARDIA)    Difficulty of Paying Living Expenses: Not very hard  Food Insecurity: No Food Insecurity (10/03/2023)   Hunger Vital Sign    Worried About Running Out of Food in the Last Year: Never true    Ran Out of Food in the Last Year: Never true  Transportation Needs: No Transportation Needs (10/03/2023)   PRAPARE - Administrator, Civil Service (Medical): No    Lack of Transportation (Non-Medical): No  Physical Activity: Insufficiently Active (10/03/2023)   Exercise Vital Sign    Days of Exercise per Week: 2 days    Minutes of Exercise per Session: 40 min  Stress: No Stress Concern Present (10/03/2023)   [employer] of Occupational Health - Occupational Stress Questionnaire    Feeling of Stress : Only a little  Social Connections: Socially Integrated (10/03/2023)   Social Connection and Isolation Panel    Frequency of Communication with Friends and Family: Twice a  week    Frequency of Social Gatherings with Friends and Family: Once a week    Attends Religious Services: More than 4 times per year    Active Member of [employer] or Organizations: Yes    Attends Engineer, structural: More than 4 times per year    Marital Status: Married     FAMILY HISTORY:  We obtained a detailed, 4-generation family history.  Significant diagnoses are listed below: Family History  Problem Relation Age of Onset   Uterine cancer Mother 77 - 27   Skin cancer Mother 67 - 21   Endometrial cancer Mother    Depression Mother    Hyperlipidemia Mother    Hypertension Mother    Colon cancer Father 69   Melanoma Father 44   Intellectual  disability Sister    Learning disabilities Sister    Dementia Sister 79   Dementia Maternal Uncle 53 - 77   Alzheimer's disease Paternal Aunt 45 - 73   Alzheimer's disease Paternal Uncle 83 - 110   Colon cancer Maternal Grandmother 20 - 29   Diabetes Maternal Grandfather    Alzheimer's disease Paternal Grandfather 5 - 74   Polycystic ovary syndrome Daughter    Anxiety disorder Son    Depression Son    Rectal cancer Niece 31 - 30   Ovarian cancer Neg Hx     Ms. Rigdon is unaware of previous family history of genetic testing for hereditary cancer risks. She does report that her youngest sister had done [facility] genetic testing that identified an increased risk for Alzheimer's disease. Unsure of specifics of result. There is no reported Ashkenazi Jewish ancestry.      GENETIC COUNSELING ASSESSMENT: Ms. Funches is a 68 y.o. female with a family history of cancer which is somewhat suggestive of a hereditary predisposition to cancer given her family history of a first degree relative diagnosed with uterine cancer and two second degree relatives diagnosed with colon cancer under the age of 58. We, therefore, discussed and recommended the following at today's visit.   DISCUSSION: We discussed that 5 - 10% of cancer is hereditary, with most cases of hereditary colon and uterine cancer associated with Lynch syndrome (caused by a pathogenic variant or mutation in one of 5 genes - MLH1, MSH2, MSH6, PSM2, EPCAM).  There are other genes that can be associated with hereditary cancer syndromes.  We discussed that testing is beneficial for several reasons, including knowing about other cancer risks, identifying potential screening and risk-reduction options that may be appropriate, and to understanding if other family members could be at risk for cancer and allowing them to undergo genetic testing.  We reviewed the characteristics, features and inheritance patterns of hereditary cancer syndromes. We also discussed  genetic testing, including the appropriate family members to test, the process of testing, insurance coverage and turn-around-time for results. We discussed the implications of a negative, positive, carrier and/or variant of uncertain significant result. We discussed that negative results would be uninformative given that Ms. Sofia does not have a personal history of cancer. We recommended Ms. Wimer pursue genetic testing for a panel that contains genes associated with colon and uterine cancer.  Ms. Laverdiere was offered a common hereditary cancer panel (48 genes) and an expanded pan-cancer panel (70 genes). Ms. Longie was informed of the benefits and limitations of each panel, including that expanded pan-cancer panels contain several genes that do not have clear management guidelines at this point in time.  We also discussed  that as the number of genes included on a panel increases, the chances of variants of uncertain significance increases.  After considering the benefits and limitations of each gene panel, Ms. Harbison elected to have [facility]'s Common Hereditary Cancers +RNAinsight panel. The [facility] Common Hereditary Cancers panel includes analysis of the following 48 genes: APC, ATM, AXIN2, BAP1, BARD1, BMPR1A, BRCA1, BRCA2, BRIP1, CDH1, CDK4, CDKN2A, CHEK2, CTNNA1, DICER1, EPCAM, FH, GREM1, HOXB13, KIT, MBD4, MEN1, MLH1, MSH2, MSH3, MSH6, MUTYH, NF1, NTHL1, PALB2, PDGFRA, PMS2, POLD1, POLE, PTEN, RAD51C, RAD51D, SDHA, SDHB, SDHC, SDHD, SMAD4, SMARCA4, STK11, TP53, TSC1, TSC2, VHL.   Based on Ms. Boyden's family history of cancer, she meets medical criteria for genetic testing. Though Ms. Brancato is not personally affected, there are no affected family members that are willing/able to undergo hereditary cancer testing. Despite that she meets criteria, she may still have an out of pocket cost. We discussed that if her out of pocket cost for testing is over $100, the laboratory should contact them to discuss self-pay prices,  patient pay assistance programs, if applicable, and other billing options.  We discussed that some people do not want to undergo genetic testing due to fear of genetic discrimination.  A federal law called the Genetic Information Non-Discrimination Act (GINA) of 2008 helps protect individuals against genetic discrimination based on their genetic test results.  It impacts both health insurance and employment.  With health insurance, it protects against increased premiums, being kicked off insurance or being forced to take a test in order to be insured.  For employment it protects against hiring, firing and promoting decisions based on genetic test results.  GINA does not apply to those in the [employer], those who work for companies with less than 15 employees, and new life insurance or long-term disability insurance policies.  Health status due to a cancer diagnosis is not protected under GINA.  Additionally, we discussed that Ms. Pressey's family may want to consider genetic testing for hereditary causes of dementia. Her sister, diagnosed at 46, would be the best person to have testing. Provided information for [facility] genetics clinic.   Her sons could also consider testing based on her husband's history of kidney cancer <45 y.o.   PLAN: After considering the risks, benefits, and limitations, Ms. Tursi provided informed consent to pursue genetic testing and the blood sample was sent to [facility] for analysis of the Common Hereditary Cancers +RNA panel. Results should be available within approximately 2-3 weeks' time, at which point they will be disclosed by telephone to Ms. Klingler, as will any additional recommendations warranted by these results. Ms. Hornbaker will receive a summary of her genetic counseling visit and a copy of her results once available. This information will also be available in Epic.   Based on Ms. Gulledge's family history, we recommended her niece and mother have genetic  counseling and testing. Ms. Vadala will let us  know if we can be of any assistance in coordinating genetic counseling and/or testing for this family member.   Lastly, we encouraged Ms. Hingle to remain in contact with cancer genetics annually so that we can continuously update the family history and inform her of any changes in cancer genetics and testing that may be of benefit for this family.   Ms. Byrum questions were answered to her satisfaction today. Our contact information was provided should additional questions or concerns arise. Thank you for the referral and allowing us  to share in the care of your patient.  Burnard Ogren, MS, [facility] Licensed, Retail [email or web address] phone: [phone]   50 minutes were spent on the date of the encounter in service to the patient including preparation, face-to-face consultation, documentation and care coordination.  The patient was seen alone.  Drs. Gudena and/or Lanny were available to discuss this case as needed.  _______________________________________________________________________ For Office Staff:  Number of people involved in session: 1 Was an Intern/ student involved with case: no

## 2024-01-13 ENCOUNTER — Encounter: Payer: Self-pay | Admitting: Dietician

## 2024-01-13 ENCOUNTER — Encounter: Attending: Surgery | Admitting: Dietician

## 2024-01-13 VITALS — Ht 69.5 in | Wt 255.1 lb

## 2024-01-13 DIAGNOSIS — E669 Obesity, unspecified: Secondary | ICD-10-CM | POA: Diagnosis not present

## 2024-01-13 NOTE — Progress Notes (Signed)
 Nutrition Assessment for Bariatric Surgery: Pre-Surgery Behavioral and Nutrition Intervention Program   Medical Nutrition Therapy  Appt Start Time: 0807    End Time: 0913  Patient was seen on 01/13/2024 for Pre-Operative Nutrition Assessment. Purpose of todays visit  enhance perioperative outcomes along with a healthy weight maintenance   Referral stated Supervised Weight Loss (SWL) visits needed: 0  Pt completed visits.   Pt has cleared nutrition requirements.   Planned surgery: Gastric By-Pass Pt expectation of surgery: to lose about 100 lbs  NUTRITION ASSESSMENT   Anthropometrics  Start weight at [facility]: 255.1 lbs (date: 01/13/2024)  Height: 69.5 in BMI: 37.13 kg/m2     Clinical   Pharmacotherapy: History of weight loss medication used: none  Medical hx: obesity Medications: potassium chloride   Labs: cholesterol 254, LDL 157, Vit D 22.0, Vit B12 >2000,  Supplements: multivitamin, Vit B12, Biotin, iron infusions, iron supplement (Ferrex) Notable signs/symptoms: none noted Any previous deficiencies? No  Evaluation of Nutritional Deficiencies: Micronutrient Nutrition Focused Physical Exam: Hair: No issues observed Eyes: No issues observed Mouth: No issues observed Neck: No issues observed Nails: No issues observed Skin: No issues observed  Lifestyle & Dietary Hx  Pt states she gets iron infusions about every 6 months, stating her doctor recommended coming off of vitamin D supplements, due to iron not absorbing.   Current Physical Activity Recommendations state 150 minutes per week of moderate to vigorous movement including Cardio and 1-2 days of resistance activities as well as flexibility/balance activities:  Pts current physical activity: gym 1-2 days per week, light weights 45 minutes, with 30% recommendation reached   Sleep Hygiene: duration and quality: pretty good, states she gets about 5 hours per night  Current Patient Perceived Stress Level as stated by pt  on a scale of 1-10:  7       Stress Management Techniques: gym, walking  According to the Dietary Guidelines for Americans Recommendation: equivalent 1.5-2 cups fruits per day, equivalent 2-3 cups vegetables per day and at least half all grains whole  Fruit servings per day (on average): 1, meeting 50% recommendation  Non-starchy vegetable servings per day (on average): 1, meeting 33-50% recommendation  Whole Grains per day (on average): 0  Number of meals missed/skipped per week out of 21: 5  24-Hr Dietary Recall First Meal: skip during the work week or eggs, chicken bacon ([employer]) or protein bar Snack:  Second Meal: spinach, zucchini, ground [place], crackers, apple or sometimes a salad Snack: pretzels Third Meal: meat (fish) and vegetables Snack: popcorn or fruit Beverages: water, herb tea, small coke once a day (morning)  Alcoholic beverages per week: 0   Estimated Energy Needs Calories: 1500  NUTRITION DIAGNOSIS  Overweight/obesity ([place]-3.3) related to past poor dietary habits and physical inactivity as evidenced by patient w/ planned by-pass surgery following dietary guidelines for continued weight loss.  NUTRITION INTERVENTION  Nutrition counseling (C-1) and education (E-2) to facilitate bariatric surgery goals.  Educated pt on micronutrient deficiencies post-surgery and behavioral/dietary strategies to start in order to mitigate that risk   Behavioral and Dietary Interventions Pre-Op Goals Reviewed with the Patient Nutrition: Healthy Eating Behaviors Switch to non-caloric, non-carbonated and non-caffeinated beverages such as  water, unsweetened tea, Crystal Light and zero calorie beverages (aim for 64 oz. per day) Cut out grazing between meals or at night  Find a protein shake you like Eat every 3-5 hours        Eliminate distractions while eating (TV, computer, reading, driving, texting) Take 20-30  minutes to eat a meal  Decrease high sugar foods/decrease high fat/fried  foods Eliminate alcoholic beverages Increase protein intake (eggs, fish, chicken, yogurt) before surgery Eat non starchy vegetables 2 times a day 7 days a week Eat complex carbohydrates such as whole grains and fruits   Behavioral Modification: Physical Activity Increase my usual daily activity (use stairs, park farther, etc.) Engage in _______________________  activity  _______ minutes ______ times per week  Other:    _________________________________________________________________     Problem Solving I will think about my usual eating patterns and how to tweak them How can my friends and family support me Barriers to starting my changes Learn and understand appetite verses hunger   Healthy Coping Allow for ___________ activities per week to help me manage stress Reframe negative thoughts I will keep a picture of someone or something that is my inspiration & look at it daily   Monitoring  Weigh myself once a week  Measure my progress by monitoring how my clothes fit Keep a food record of what I eat and drink for the next ________ (time period) Take pictures of what I eat and drink for the next ________ (time period) Use an app to count steps/day for the next_______ (time period) Measure my progress such as increased energy and more restful sleep Monitor your acid reflux and bowel habits, are they getting better?   *Goals that are bolded indicate the pt would like to start working towards these  Handouts Provided Include  Bariatric Surgery handouts (Nutrition Visits, Pre Surgery Behavioral Change Goals, Protein Shakes Brands to Choose From, Vitamins & Mineral Supplementation)  Learning Style & Readiness for Change Teaching method utilized: Visual, Auditory, and hands on  Demonstrated degree of understanding via: Teach Back  Readiness Level: preparation Barriers to learning/adherence to lifestyle change: nothing identified  RD's Notes for Next Visit    MONITORING &  EVALUATION Dietary intake, weekly physical activity, body weight, and preoperative behavioral change goals   Next Steps  Pt has completed visits. No further supervised visits required/recommended. Patient is to follow up at [facility] for pre-op class >2 weeks prior to scheduled surgery.

## 2024-02-01 DIAGNOSIS — D509 Iron deficiency anemia, unspecified: Secondary | ICD-10-CM | POA: Diagnosis not present

## 2024-02-03 ENCOUNTER — Telehealth: Payer: Self-pay | Admitting: Genetic Counselor

## 2024-02-03 NOTE — Telephone Encounter (Signed)
 LVM asking for call back to review results of genetic testing.

## 2024-02-06 ENCOUNTER — Ambulatory Visit: Payer: Self-pay | Admitting: Genetic Counselor

## 2024-02-06 DIAGNOSIS — Z1379 Encounter for other screening for genetic and chromosomal anomalies: Secondary | ICD-10-CM | POA: Insufficient documentation

## 2024-02-06 NOTE — Progress Notes (Signed)
 HPI:  Natalie Myers was previously seen in the [facility] due to a family history of cancer and concerns regarding a hereditary predisposition to cancer. Please refer to our prior cancer genetics clinic note for more information regarding our discussion, assessment and recommendations, at the time. Natalie Myers recent genetic test results were disclosed to her, as were recommendations warranted by these results. These results and recommendations are discussed in more detail below.  FAMILY HISTORY:  We obtained a detailed, 4-generation family history.  Significant diagnoses are listed below: Family History  Problem Relation Age of Onset   Uterine cancer Mother 40 - 74   Skin cancer Mother 39 - 74   Endometrial cancer Mother    Depression Mother    Hyperlipidemia Mother    Hypertension Mother    Colon cancer Father 30   Melanoma Father 70   Intellectual disability Sister    Learning disabilities Sister    Dementia Sister 105   Dementia Maternal Uncle 70 - 74   Alzheimer's disease Paternal Aunt 106 - 41   Alzheimer's disease Paternal Uncle 64 - 69   Colon cancer Maternal Grandmother 20 - 29   Diabetes Maternal Grandfather    Alzheimer's disease Paternal Grandfather 11 - 27   Polycystic ovary syndrome Daughter    Anxiety disorder Son    Depression Son    Rectal cancer Niece 5 - 63   Ovarian cancer Neg Hx       GENETIC TEST RESULTS: Genetic testing reported out on 01/18/24 through the Common Hereditary Cancers + RNA panel found no pathogenic mutations. The [facility] Common Hereditary Cancers panel includes analysis of the following 48 genes: APC, ATM, AXIN2, BAP1, BARD1, BMPR1A, BRCA1, BRCA2, BRIP1, CDH1, CDK4, CDKN2A, CHEK2, CTNNA1, DICER1, EPCAM, FH, GREM1, HOXB13, KIT, MBD4, MEN1, MLH1, MSH2, MSH3, MSH6, MUTYH, NF1, NTHL1, PALB2, PDGFRA, PMS2, POLD1, POLE, PTEN, RAD51C, RAD51D, SDHA, SDHB, SDHC, SDHD, SMAD4, SMARCA4, STK11, TP53, TSC1, TSC2, VHL. The test report has been  scanned into EPIC and is located under the Molecular Pathology section of the Results Review tab.  A portion of the result report is included below for reference.     We discussed with Natalie Myers that because current genetic testing is not perfect, it is possible there may be a gene mutation in one of these genes that current testing cannot detect, but that chance is small.  We also discussed, that there could be another gene that has not yet been discovered, or that we have not yet tested, that is responsible for the cancer diagnoses in the family. It is also possible there is a hereditary cause for the cancer in the family that Natalie Myers did not inherit and therefore was not identified in her testing.  Therefore, it is important to remain in touch with cancer genetics in the future so that we can continue to offer Natalie Myers the most up to date genetic testing.    ADDITIONAL GENETIC TESTING: We discussed with Natalie Myers that there are other genes that are associated with increased cancer risk that can be analyzed. Should Natalie Myers wish to pursue additional genetic testing, we are happy to discuss and coordinate this testing, at any time.    CANCER SCREENING RECOMMENDATIONS: Natalie Myers test result is considered negative (normal).  This means that we have not identified a hereditary cause for her family history of cancer at this time. Most cancers happen by chance and this negative test suggests that her family  history of cancer may fall into this category.    Possible reasons for Natalie Myers's negative genetic test include:  1. There may be a gene mutation in one of these genes that current testing methods cannot detect but that chance is small.  2. There could be another gene that has not yet been discovered, or that we have not yet tested, that is responsible for the cancer diagnoses in the family.  3.  There may be no hereditary risk for cancer in the family. The cancers in Natalie Myers and/or her family may be  sporadic/familial or due to other genetic and environmental factors. 4. It is also possible there is a hereditary cause for the cancer in the family that Natalie Myers did not inherit.  Therefore, it is recommended she continue to follow the cancer management and screening guidelines provided by her primary healthcare provider. An individual's cancer risk and medical management are not determined by genetic test results alone. Overall cancer risk assessment incorporates additional factors, including personal medical history, family history, and any available genetic information that may result in a personalized plan for cancer prevention and surveillance  Given Natalie Myers's family histories, we must interpret these negative results with some caution.  Families with features suggestive of hereditary risk for cancer tend to have multiple family members with cancer, diagnoses in multiple generations and diagnoses before the age of 57. Natalie Myers family exhibits some of these features. Thus, this result may simply reflect our current inability to detect all mutations within these genes or there may be a different gene that has not yet been discovered or tested.   Based on Natalie Myers's family history of a first degree relative with colorectal cancer, the National [employer] Network: Colorectal Cancer Screening v2.2025 recommends that colonoscopies begin at age 52 (or 10 years prior to the age at diagnosis of colorectal cancer in their close relative) and are repeated at least every 5 years, or more often as needed.   RECOMMENDATIONS FOR FAMILY MEMBERS:  Individuals in this family might be at some increased risk of developing cancer, over the general population risk, simply due to the family history of cancer.  We recommended women in this family have a yearly mammogram beginning at age 35, or 2 years younger than the earliest onset of cancer, an annual clinical breast exam, and perform monthly breast  self-exams. Women in this family should also have a gynecological exam as recommended by their primary provider. All family members should be referred for colonoscopy starting at age 16, or 29 years younger than the earliest onset of cancer.  It is also possible there is a hereditary cause for the cancer in Natalie Myers family that she did not inherit and therefore was not identified in her.  Genetic testing would be helpful for relatives diagnosed with cancer and may be useful to unaffected relatives as well. Natalie Myers will let us  know if we can be of any assistance in coordinating genetic counseling and/or testing for this family member.   FOLLOW-UP: Lastly, we discussed with Ms. Pilling that cancer genetics is a rapidly advancing field and it is possible that new genetic tests will be appropriate for her and/or her family members in the future. We encouraged her to remain in contact with cancer genetics on an annual basis so we can update her personal and family histories and let her know of advances in cancer genetics that may benefit this family.   Our contact number was provided.  Ms. Googe questions were answered to her satisfaction, and she knows she is welcome to call us  at anytime with additional questions or concerns.   Burnard Ogren, MS, [facility] Licensed, Retail [email or web address] [phone]

## 2024-02-06 NOTE — Telephone Encounter (Signed)
 I spoke to Natalie Myers to review results of genetic testing. she had genetic testing with with the [facility] Hereditary Caners panel +RNA. Testing did not identify any variants known to increase the risk for cancer.  Discussed that we do not know why there is cancer in the family. It could be due to a different gene that we are not testing, or maybe our current technology may not be able to pick something up.  It will be important for her to keep in contact with genetics to keep up with whether additional testing may be needed.  Please see counseling note for further detail on this result.

## 2024-02-09 ENCOUNTER — Other Ambulatory Visit: Payer: Self-pay | Admitting: Family Medicine

## 2024-03-01 ENCOUNTER — Encounter: Payer: Self-pay | Admitting: Family Medicine
